# Patient Record
Sex: Male | Born: 1980
Health system: Southern US, Community
[De-identification: ages and names within clinical notes are randomized; demographics above are authoritative.]

## PROBLEM LIST (undated history)

## (undated) DIAGNOSIS — K219 Gastro-esophageal reflux disease without esophagitis: Secondary | ICD-10-CM

## (undated) DIAGNOSIS — J45909 Unspecified asthma, uncomplicated: Secondary | ICD-10-CM

## (undated) DIAGNOSIS — J349 Unspecified disorder of nose and nasal sinuses: Secondary | ICD-10-CM

## (undated) DIAGNOSIS — G43909 Migraine, unspecified, not intractable, without status migrainosus: Secondary | ICD-10-CM

## (undated) DIAGNOSIS — I10 Essential (primary) hypertension: Secondary | ICD-10-CM

## (undated) DIAGNOSIS — F419 Anxiety disorder, unspecified: Secondary | ICD-10-CM

## (undated) DIAGNOSIS — S060X0A Concussion without loss of consciousness, initial encounter: Secondary | ICD-10-CM

## (undated) DIAGNOSIS — M545 Low back pain, unspecified: Secondary | ICD-10-CM

## (undated) HISTORY — DX: Unspecified disorder of nose and nasal sinuses: J34.9

## (undated) HISTORY — DX: Low back pain, unspecified: M54.50

## (undated) HISTORY — DX: Essential (primary) hypertension: I10

## (undated) HISTORY — DX: Gastro-esophageal reflux disease without esophagitis: K21.9

## (undated) HISTORY — DX: Anxiety disorder, unspecified: F41.9

## (undated) HISTORY — DX: Unspecified asthma, uncomplicated: J45.909

## (undated) HISTORY — DX: Concussion without loss of consciousness, initial encounter: S06.0X0A

## (undated) HISTORY — DX: Migraine, unspecified, not intractable, without status migrainosus: G43.909

## (undated) HISTORY — PX: CHOLECYSTECTOMY: SHX55

---

## 2013-12-18 ENCOUNTER — Ambulatory Visit (INDEPENDENT_AMBULATORY_CARE_PROVIDER_SITE_OTHER): Payer: BC Managed Care – PPO

## 2013-12-18 VITALS — BP 110/73 | HR 96 | Resp 18

## 2013-12-18 DIAGNOSIS — B351 Tinea unguium: Secondary | ICD-10-CM

## 2013-12-18 MED ORDER — TAVABOROLE 5 % EX SOLN: CUTANEOUS | Status: DC

## 2013-12-18 NOTE — Progress Notes (Signed)
   Subjective:    Patient ID: George Grant, male    DOB: 11/16/1980, 33 y.o.   MRN: [id number]  HPI I have some toenail fungus on both my big toenails and maybe just check my other nails and they get brittle and discolored and thick and curling in and I have tried all the over the counter stuff and has not helped.    Review of Systems  Constitutional: Positive for fatigue.  HENT: Positive for sinus pressure.   Respiratory:       Uses an inhaler for allergies  Skin: Positive for rash.       Change in nails  All other systems reviewed and are negative.      Objective:   Physical Exam Neurovascular status is intact pedal pulses palpable DP and PT +2/4 capillary refill time 3 seconds epicritic and proprioceptive sensations intact and symmetric bilateral is normal plantar response DTRs not listed neurologically skin color pigment and hair growth are normal orthopedic biomechanical exam reveals rectus foot type dermatologically there is thickening discoloration and friability of nails hallux bilateral no severe of all the lesser nails also shows some thickening yellowing discoloration and brittleness consistent with onychomycosis no secondary bacterial infections no malodor is noted patient's tried a variety topical antifungal treatments he declined Fungi-Nail for the last 6-12 months with some improvement although not consistent clearing which she for      Assessment & Plan:  Assessment onychomycosis criptotic brittle crumbly darkened nails hallux bilateral as well as second through fifth digits bilateral show some dystrophy discoloration and friability patient this time is prescribed new antifungal medications Kerydin with instructions to apply daily as instructed for 12 months duration prescription 40 to be a mail order pharmacy and patient will followup in 6 months for reevaluation and assessment of progress next  [employer] Tyqwan Pink DPm

## 2013-12-18 NOTE — Patient Instructions (Signed)
Onychomycosis/Fungal Toenails  WHAT IS IT? An infection that lies within the keratin of your nail plate that is caused by a fungus.  WHY ME? Fungal infections affect all ages, sexes, races, and creeds.  There may be many factors that predispose you to a fungal infection such as age, coexisting medical conditions such as diabetes, or an autoimmune disease; stress, medications, fatigue, genetics, etc.  Bottom line: fungus thrives in a warm, moist environment and your shoes offer such a location.  IS IT CONTAGIOUS? Theoretically, yes.  You do not want to share shoes, nail clippers or files with someone who has fungal toenails.  Walking around barefoot in the same room or sleeping in the same bed is unlikely to transfer the organism.  It is important to realize, however, that fungus can spread easily from one nail to the next on the same foot.  HOW DO WE TREAT THIS?  There are several ways to treat this condition.  Treatment may depend on many factors such as age, medications, pregnancy, liver and kidney conditions, etc.  It is best to ask your doctor which options are available to you.  1. No treatment.   Unlike many other medical concerns, you can live with this condition.  However for many people this can be a painful condition and may lead to ingrown toenails or a bacterial infection.  It is recommended that you keep the nails cut short to help reduce the amount of fungal nail. 2. Topical treatment.  These range from herbal remedies to prescription strength nail lacquers.  About 40-50% effective, topicals require twice daily application for approximately 9 to 12 months or until an entirely new nail has grown out.  The most effective topicals are medical grade medications available through physicians offices. 3. Oral antifungal medications.  With an 80-90% cure rate, the most common oral medication requires 3 to 4 months of therapy and stays in your system for a year as the new nail grows out.  Oral  antifungal medications do require blood work to make sure it is a safe drug for you.  A liver function panel will be performed prior to starting the medication and after the first month of treatment.  It is important to have the blood work performed to avoid any harmful side effects.  In general, this medication safe but blood work is required. 4. Laser Therapy.  This treatment is performed by applying a specialized laser to the affected nail plate.  This therapy is noninvasive, fast, and non-painful.  It is not covered by insurance and is therefore, out of pocket.  The results have been very good with a 80-95% cure rate.  The [facility] is the only practice in the area to offer this therapy. 5. Permanent Nail Avulsion.  Removing the entire nail so that a new nail will not grow back.  Jodi GeraldsKerydin has been prescribed and the shoe directly to your home, apply once daily to each affected nail as instructed for 12 months

## 2014-02-20 ENCOUNTER — Emergency Department: Payer: Self-pay | Admitting: Emergency Medicine

## 2014-03-03 ENCOUNTER — Telehealth: Payer: Self-pay | Admitting: *Deleted

## 2014-03-03 NOTE — Telephone Encounter (Signed)
Issue with prescription I'm on, Kerydin.  It was doing good for a couple of months.  He at the end of last week, skin around every toe has blistered up.  Please give me a call.

## 2014-03-03 NOTE — Telephone Encounter (Signed)
CALLED PATIENT AND PATIENT STATED THAT HE WAS ON KERYDIN AND COULD SEE IMPROVEMENT AND HE HAD TO TAKE DOXYCYCLINE FOR A SINUS INFECTION BUT TURNED OUT HE WAS ALLERGIC TO IT AND THEN THEY PUT HIM ON ARTHROMYCIN AND THEN HE NOTICED THAT THERE WAS BLISTERS ON HIS TOES AND HE SAID HE DIDN'T WEAR SHOES THE WHOLE WEEKEND AND THEY SEEMED TO BE BETTER TODAY. LISA

## 2014-03-04 NOTE — Telephone Encounter (Signed)
Called patient back and told him to stop the kerydin and to call us if any thing didn't look right. lisa

## 2014-03-04 NOTE — Telephone Encounter (Signed)
Could be a possible interaction between the 2 medications, discontinue the keratin polys on the antibiotic and then after a week to 10 days can resume the topical medication.  Alvan Dameichard Sarahi Borland DPM

## 2014-03-20 ENCOUNTER — Telehealth: Payer: Self-pay | Admitting: *Deleted

## 2014-03-21 NOTE — Telephone Encounter (Signed)
Will have melody call the patient and have him come in. lisa

## 2014-03-31 ENCOUNTER — Telehealth: Payer: Self-pay | Admitting: *Deleted

## 2014-03-31 NOTE — Telephone Encounter (Signed)
If you could give me a call back.  I saw [name] a couple of months ago.  The Jodi Geralds he has me on for my toe fungus is blistering my toes up.  This is the third time I have called.  I quit using it but I was supposed to have been given a call to let me know if I needed to come in or not.  Please give me a call.

## 2014-03-31 NOTE — Telephone Encounter (Signed)
Called patient back and I put patient in at 8:45 am on Wednesday sept 3rd. lisa

## 2014-04-03 ENCOUNTER — Ambulatory Visit (INDEPENDENT_AMBULATORY_CARE_PROVIDER_SITE_OTHER): Payer: BC Managed Care – PPO

## 2014-04-03 VITALS — BP 117/77 | HR 95 | Resp 18

## 2014-04-03 DIAGNOSIS — B351 Tinea unguium: Secondary | ICD-10-CM

## 2014-04-03 MED ORDER — EFINACONAZOLE 10 % EX SOLN: CUTANEOUS | Status: DC

## 2014-04-03 NOTE — Patient Instructions (Signed)

## 2014-04-03 NOTE — Progress Notes (Signed)
   Subjective:    Patient ID: George Grant, male    DOB: Nov 24, 1980, 33 y.o.   MRN: [id number]  HPI I WAS TAKEN THE KERYDIN AND I HAD TO STOP IT DUE TO BLISTERING ON ALL MY TOES AND IT WAS HELPING AND THE BLISTERS WHEN I POPPED THEM WOULD HAVE CLEAR FLUID COMING OUT AND THEY WOULD ITCH AND GET RED AND PEELING    Review of Systems no new findings or systemic changes noted     Objective:   Physical Exam 33 year old white male well-developed well-nourished oriented x3 have been applying keratin for more than 2 months when all of a sudden started developing blisters on his toes he stopped for couple of weeks and they cleared and when he restarted the blisters reappeared once again. Likely develop allergic reaction to the medication we'll discontinue at this time.  Lower extremity objective findings reveal neurovascular status is intact pedal pulses are palpable epicritic and progressive sensations intact nails thick brittle yellow discolored friable consistent with onychomycosis there are improving although needs to continue with treatment this time options for oral Lamisil or topical Amil Amen are given. Patient elected topical medication as alternative.       Assessment & Plan:  Assessment onychomycosis painful mycotic nails were been treated with Jodi Geralds however didn't an allergic reaction we'll switch over here to [place]. Patient will apply once daily as instructed the prescription for did to the on line pharmacy for shipment directly to patient recheck in 6 months for long-term followup will continue with therapy for 6-12 months as instructed  Alvan Dame DPM

## 2014-06-18 ENCOUNTER — Ambulatory Visit: Payer: BC Managed Care – PPO

## 2014-10-02 ENCOUNTER — Ambulatory Visit (INDEPENDENT_AMBULATORY_CARE_PROVIDER_SITE_OTHER): Payer: BLUE CROSS/BLUE SHIELD

## 2014-10-02 VITALS — BP 110/72 | HR 89 | Resp 18

## 2014-10-02 DIAGNOSIS — M79676 Pain in unspecified toe(s): Secondary | ICD-10-CM

## 2014-10-02 DIAGNOSIS — B351 Tinea unguium: Secondary | ICD-10-CM

## 2014-10-02 MED ORDER — EFINACONAZOLE 10 % EX SOLN: CUTANEOUS | Status: DC

## 2014-10-02 NOTE — Progress Notes (Signed)
   Subjective:    Patient ID: George Grant, male    DOB: 11/22/1980, 34 y.o.   MRN: [id number]  HPI I CAN TELL A DIFFERENCE AND THE INSURANCE WILL NOT PAY FOR THE JUBLIA    Review of Systems no new findings or systemic changes noted there is improvement clinically of the nails. Both hallux nail showing clearing proximally still some distal onychomycosis noted    Objective:   Physical Exam Neurovascular status is intact pedal pulses are palpable epicritic and proprioceptive sensations intact patient has been on Jublia using successfully however this time the pharmacies no longer entering prescription as far as cost we will have to switch over to the [facility] prescription hard. At this time a new prescription for Jublia is issued with 2 refills will continue with daily application as instructed apply just one drop each nail as instructed patient is been painting the nails. Patient will get [facility] prescription current authorized for the Jublia and we'll follow-up with in 6 months after continued use of the antifungal medication also suggested some vinegar and water soaks occasionally as an adjunct       Assessment & Plan:  Assessment improving painful mycotic nails filed treatment with Jublia at this time new refill for Jublia is issued and the patient will be followed in 6 months  Alvan Dameichard Rexann Lueras DPM

## 2015-03-05 ENCOUNTER — Telehealth: Payer: Self-pay | Admitting: *Deleted

## 2015-03-05 NOTE — Telephone Encounter (Signed)
George Grant states pt has refills for the Jublia, but for insurance purposes needs to change from [name].  Dr. Everlena Cooper the change to him and I called to the [facility].

## 2015-04-09 ENCOUNTER — Ambulatory Visit: Payer: BLUE CROSS/BLUE SHIELD

## 2015-04-13 ENCOUNTER — Ambulatory Visit (INDEPENDENT_AMBULATORY_CARE_PROVIDER_SITE_OTHER): Payer: BLUE CROSS/BLUE SHIELD | Admitting: Podiatry

## 2015-04-13 ENCOUNTER — Encounter: Payer: Self-pay | Admitting: Podiatry

## 2015-04-13 VITALS — BP 118/76 | HR 80 | Resp 12

## 2015-04-13 DIAGNOSIS — B351 Tinea unguium: Secondary | ICD-10-CM

## 2015-04-13 NOTE — Progress Notes (Signed)
Subjective:     Patient ID: George Grant, male   DOB: Oct 19, 1980, 34 y.o.   MRN: [id number]  HPIThis patient presents to the office to discuss his big toenails.  He says he has been using Jublia for over a year and he says there has been improvement but is concerned it has taken over a year.     Review of Systems     Objective:   Physical Exam GENERAL APPEARANCE: Alert, conversant. Appropriately groomed. No acute distress.  VASCULAR: Pedal pulses palpable at  [facility] and PT bilateral.  Capillary refill time is immediate to all digits,  Normal temperature gradient.  Digital hair growth is present bilateral  NEUROLOGIC: sensation is normal to 5.07 monofilament at 5/5 sites bilateral.  Light touch is intact bilateral, Muscle strength normal.  MUSCULOSKELETAL: acceptable muscle strength, tone and stability bilateral.  Intrinsic muscluature intact bilateral.  Rectus appearance of foot and digits noted bilateral.   DERMATOLOGIC: skin color, texture, and turgor are within normal limits.  No preulcerative lesions or ulcers  are seen, no interdigital maceration noted.  No open lesions present.  Digital nails are asymptomatic. No drainage noted. NAILS  The hallux right toenail and fifth toenail left foot have thick disfigured discolored nails both feet.     Assessment:  Onychomycosis B/L     Plan:     ROV>  Discussed treatment  .  Decided to order bloodwork and then change medicine to Lamisil.  To prescribe Lamisil once bloodwork is received.

## 2015-04-21 ENCOUNTER — Telehealth: Payer: Self-pay | Admitting: *Deleted

## 2015-04-21 DIAGNOSIS — Z79899 Other long term (current) drug therapy: Secondary | ICD-10-CM

## 2015-04-21 MED ORDER — TERBINAFINE HCL 250 MG PO TABS: 250.0000 mg | ORAL_TABLET | Freq: Every day | ORAL | Status: DC

## 2015-04-21 NOTE — Telephone Encounter (Signed)
Pt states he would like his blood work results and is the medication going to be called in.  Dr. Stacie Acres states pt's enzymes are okay may begin the Lamisil 239m #90 one tablet daily until gone, and repeat the Hepatic function after completion.  Pt is informed.

## 2015-05-08 NOTE — Telephone Encounter (Signed)
Entered in error

## 2015-07-15 ENCOUNTER — Telehealth: Payer: Self-pay | Admitting: *Deleted

## 2015-07-15 NOTE — Telephone Encounter (Signed)
Pt states he was told to have blood work when he finished his Lamisil.  I asked pt if he had taken all 90 doses of the lamisil and he said he was only given 60 doses by [facility].  I told him a order for 90 doses of Lamisil had been e-scribed and received on 04/21/2015 at 127pm, that they may have given him a partial rx due to supply that he needed to check with [facility] to make sure he had completed therapy.  I told pt I would check with Dr. Stacie AcresMayer if he wanted the blood work ordered after the completed therapy.

## 2017-12-31 DIAGNOSIS — J209 Acute bronchitis, unspecified: Secondary | ICD-10-CM | POA: Diagnosis not present

## 2018-02-26 ENCOUNTER — Ambulatory Visit
Admission: RE | Admit: 2018-02-26 | Discharge: 2018-02-26 | Disposition: A | Discharge status: Home / Self Care | Payer: Worker's Compensation | Source: Ambulatory Visit | Attending: Physician Assistant | Admitting: Physician Assistant

## 2018-02-26 ENCOUNTER — Other Ambulatory Visit: Payer: Self-pay | Admitting: Physician Assistant

## 2018-02-26 DIAGNOSIS — M419 Scoliosis, unspecified: Secondary | ICD-10-CM | POA: Insufficient documentation

## 2018-02-26 DIAGNOSIS — M545 Low back pain: Secondary | ICD-10-CM

## 2018-05-09 DIAGNOSIS — M546 Pain in thoracic spine: Secondary | ICD-10-CM | POA: Diagnosis not present

## 2018-05-09 DIAGNOSIS — M5136 Other intervertebral disc degeneration, lumbar region: Secondary | ICD-10-CM | POA: Diagnosis not present

## 2018-05-09 DIAGNOSIS — M62830 Muscle spasm of back: Secondary | ICD-10-CM | POA: Diagnosis not present

## 2018-05-09 DIAGNOSIS — M5441 Lumbago with sciatica, right side: Secondary | ICD-10-CM | POA: Diagnosis not present

## 2018-06-05 DIAGNOSIS — M5441 Lumbago with sciatica, right side: Secondary | ICD-10-CM | POA: Diagnosis not present

## 2018-06-05 DIAGNOSIS — M546 Pain in thoracic spine: Secondary | ICD-10-CM | POA: Diagnosis not present

## 2018-06-05 DIAGNOSIS — M62830 Muscle spasm of back: Secondary | ICD-10-CM | POA: Diagnosis not present

## 2018-06-05 DIAGNOSIS — M5136 Other intervertebral disc degeneration, lumbar region: Secondary | ICD-10-CM | POA: Diagnosis not present

## 2018-06-18 DIAGNOSIS — R10811 Right upper quadrant abdominal tenderness: Secondary | ICD-10-CM | POA: Diagnosis not present

## 2018-06-18 DIAGNOSIS — K210 Gastro-esophageal reflux disease with esophagitis: Secondary | ICD-10-CM | POA: Diagnosis not present

## 2018-07-03 DIAGNOSIS — M546 Pain in thoracic spine: Secondary | ICD-10-CM | POA: Diagnosis not present

## 2018-07-03 DIAGNOSIS — M5136 Other intervertebral disc degeneration, lumbar region: Secondary | ICD-10-CM | POA: Diagnosis not present

## 2018-07-03 DIAGNOSIS — M62830 Muscle spasm of back: Secondary | ICD-10-CM | POA: Diagnosis not present

## 2018-07-03 DIAGNOSIS — M5441 Lumbago with sciatica, right side: Secondary | ICD-10-CM | POA: Diagnosis not present

## 2018-07-04 DIAGNOSIS — R948 Abnormal results of function studies of other organs and systems: Secondary | ICD-10-CM | POA: Diagnosis not present

## 2018-07-04 DIAGNOSIS — R10811 Right upper quadrant abdominal tenderness: Secondary | ICD-10-CM | POA: Diagnosis not present

## 2018-07-09 DIAGNOSIS — Z6831 Body mass index (BMI) 31.0-31.9, adult: Secondary | ICD-10-CM | POA: Insufficient documentation

## 2018-07-09 DIAGNOSIS — K811 Chronic cholecystitis: Secondary | ICD-10-CM | POA: Diagnosis not present

## 2018-07-09 DIAGNOSIS — R1013 Epigastric pain: Secondary | ICD-10-CM | POA: Diagnosis not present

## 2018-07-09 DIAGNOSIS — K828 Other specified diseases of gallbladder: Secondary | ICD-10-CM | POA: Insufficient documentation

## 2018-07-18 DIAGNOSIS — Z6831 Body mass index (BMI) 31.0-31.9, adult: Secondary | ICD-10-CM | POA: Diagnosis not present

## 2018-07-18 DIAGNOSIS — K219 Gastro-esophageal reflux disease without esophagitis: Secondary | ICD-10-CM | POA: Diagnosis not present

## 2018-07-18 DIAGNOSIS — K828 Other specified diseases of gallbladder: Secondary | ICD-10-CM | POA: Diagnosis not present

## 2018-07-18 DIAGNOSIS — K839 Disease of biliary tract, unspecified: Secondary | ICD-10-CM | POA: Diagnosis not present

## 2018-07-18 DIAGNOSIS — K811 Chronic cholecystitis: Secondary | ICD-10-CM | POA: Diagnosis not present

## 2018-07-18 DIAGNOSIS — E669 Obesity, unspecified: Secondary | ICD-10-CM | POA: Diagnosis not present

## 2018-07-30 DIAGNOSIS — Z09 Encounter for follow-up examination after completed treatment for conditions other than malignant neoplasm: Secondary | ICD-10-CM | POA: Insufficient documentation

## 2018-08-09 DIAGNOSIS — J028 Acute pharyngitis due to other specified organisms: Secondary | ICD-10-CM | POA: Diagnosis not present

## 2018-08-09 DIAGNOSIS — J09X2 Influenza due to identified novel influenza A virus with other respiratory manifestations: Secondary | ICD-10-CM | POA: Diagnosis not present

## 2018-08-21 DIAGNOSIS — M5441 Lumbago with sciatica, right side: Secondary | ICD-10-CM | POA: Diagnosis not present

## 2018-08-21 DIAGNOSIS — M62830 Muscle spasm of back: Secondary | ICD-10-CM | POA: Diagnosis not present

## 2018-08-21 DIAGNOSIS — M5136 Other intervertebral disc degeneration, lumbar region: Secondary | ICD-10-CM | POA: Diagnosis not present

## 2018-08-21 DIAGNOSIS — M546 Pain in thoracic spine: Secondary | ICD-10-CM | POA: Diagnosis not present

## 2018-09-11 DIAGNOSIS — M546 Pain in thoracic spine: Secondary | ICD-10-CM | POA: Diagnosis not present

## 2018-09-11 DIAGNOSIS — M62830 Muscle spasm of back: Secondary | ICD-10-CM | POA: Diagnosis not present

## 2018-09-11 DIAGNOSIS — M5441 Lumbago with sciatica, right side: Secondary | ICD-10-CM | POA: Diagnosis not present

## 2018-09-11 DIAGNOSIS — M5136 Other intervertebral disc degeneration, lumbar region: Secondary | ICD-10-CM | POA: Diagnosis not present

## 2018-09-13 DIAGNOSIS — K219 Gastro-esophageal reflux disease without esophagitis: Secondary | ICD-10-CM | POA: Diagnosis not present

## 2018-10-09 DIAGNOSIS — M62830 Muscle spasm of back: Secondary | ICD-10-CM | POA: Diagnosis not present

## 2018-10-09 DIAGNOSIS — M546 Pain in thoracic spine: Secondary | ICD-10-CM | POA: Diagnosis not present

## 2018-10-09 DIAGNOSIS — M5136 Other intervertebral disc degeneration, lumbar region: Secondary | ICD-10-CM | POA: Diagnosis not present

## 2018-10-09 DIAGNOSIS — M5441 Lumbago with sciatica, right side: Secondary | ICD-10-CM | POA: Diagnosis not present

## 2018-10-28 IMAGING — CR DG LUMBAR SPINE COMPLETE 4+V
5 series · 6 of 6 positions shown · non-contrast
Comparison: None.

CLINICAL DATA: Acute lumbar pain.

EXAM:
LUMBAR SPINE - COMPLETE 4+ VIEW

[l-spine ap]
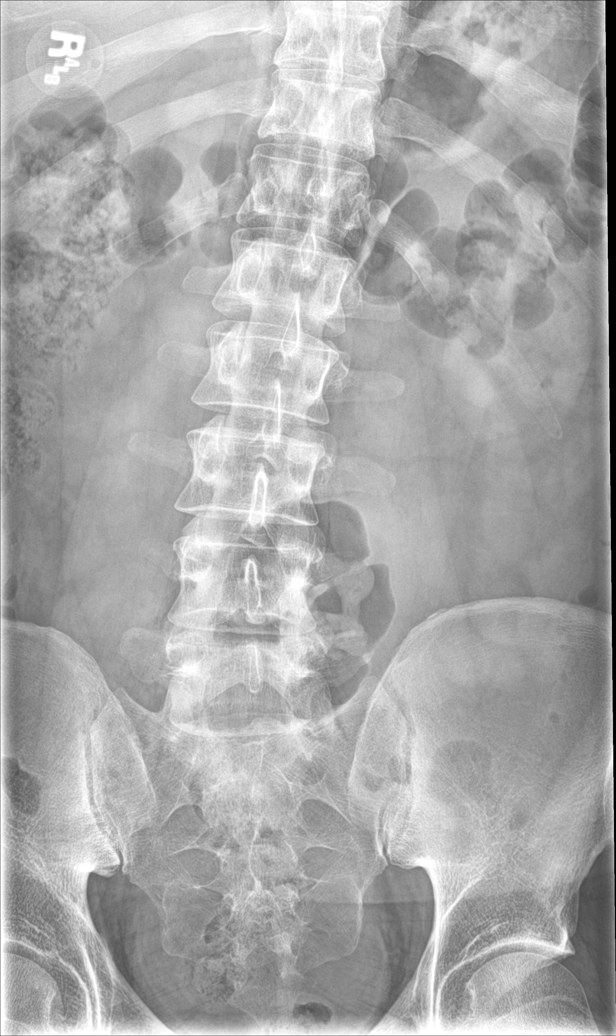

[l-spine obl (1 of 2)]
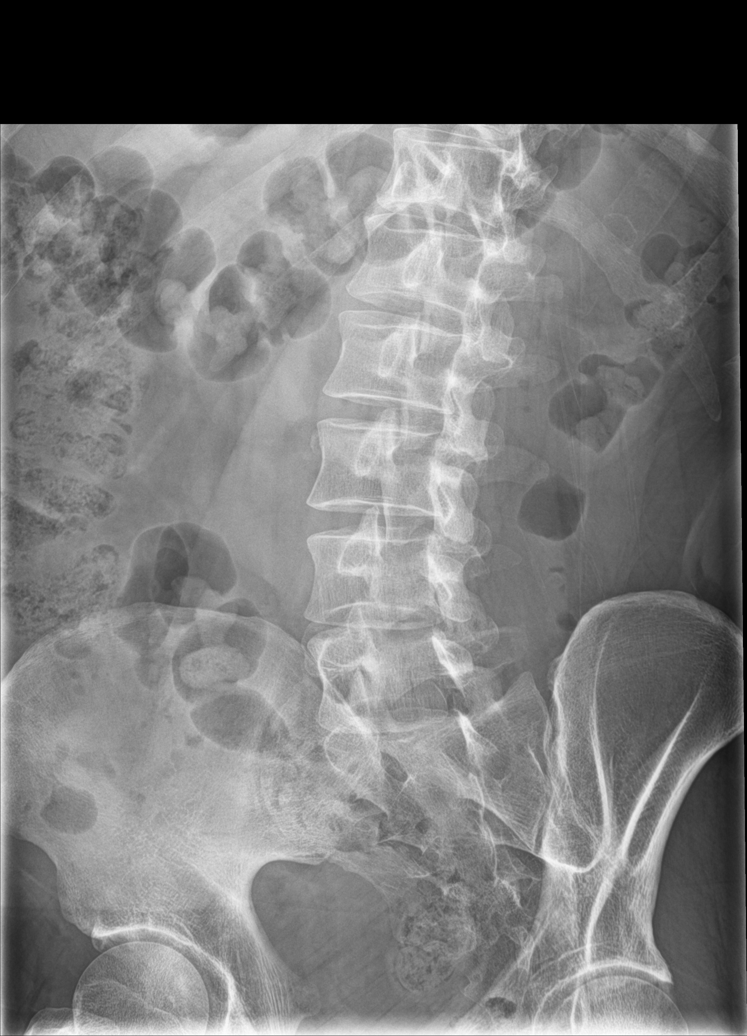

[l-spine obl (2 of 2)]
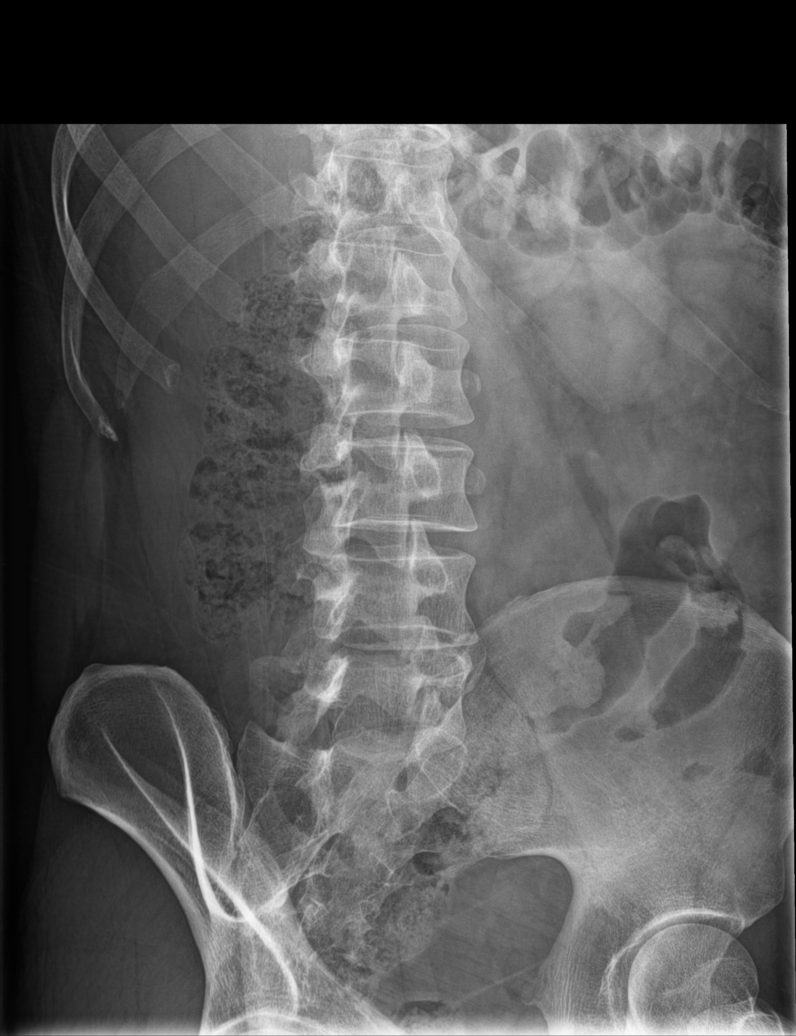

[Series 4: l-spine lat · 0.14mm/px · 2 of 2 slices shown]
[im 1/2]
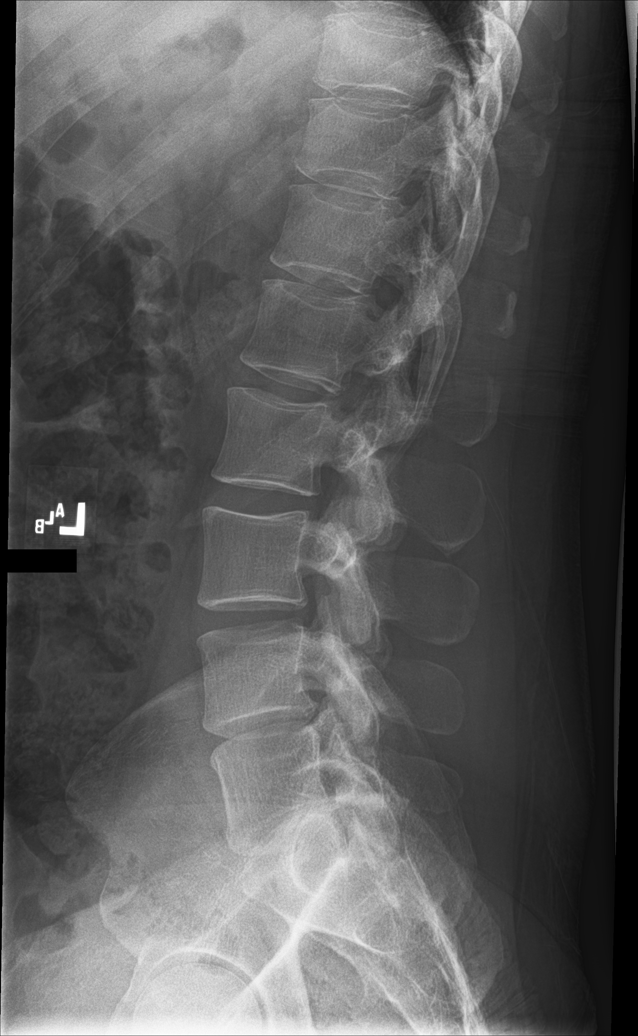
[im 2/2]
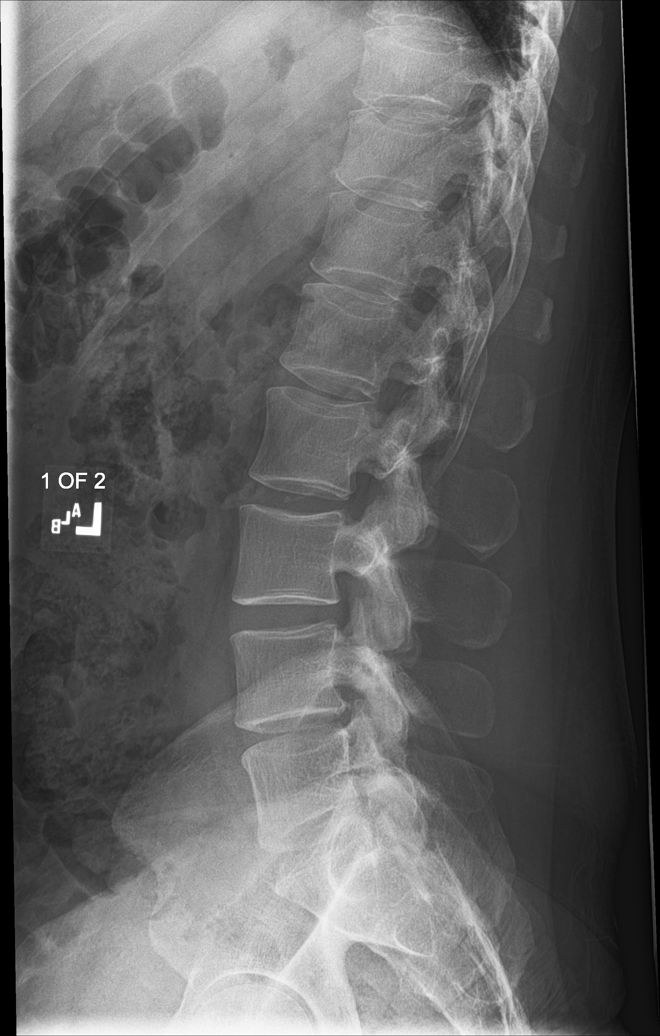

[l-spine spot]
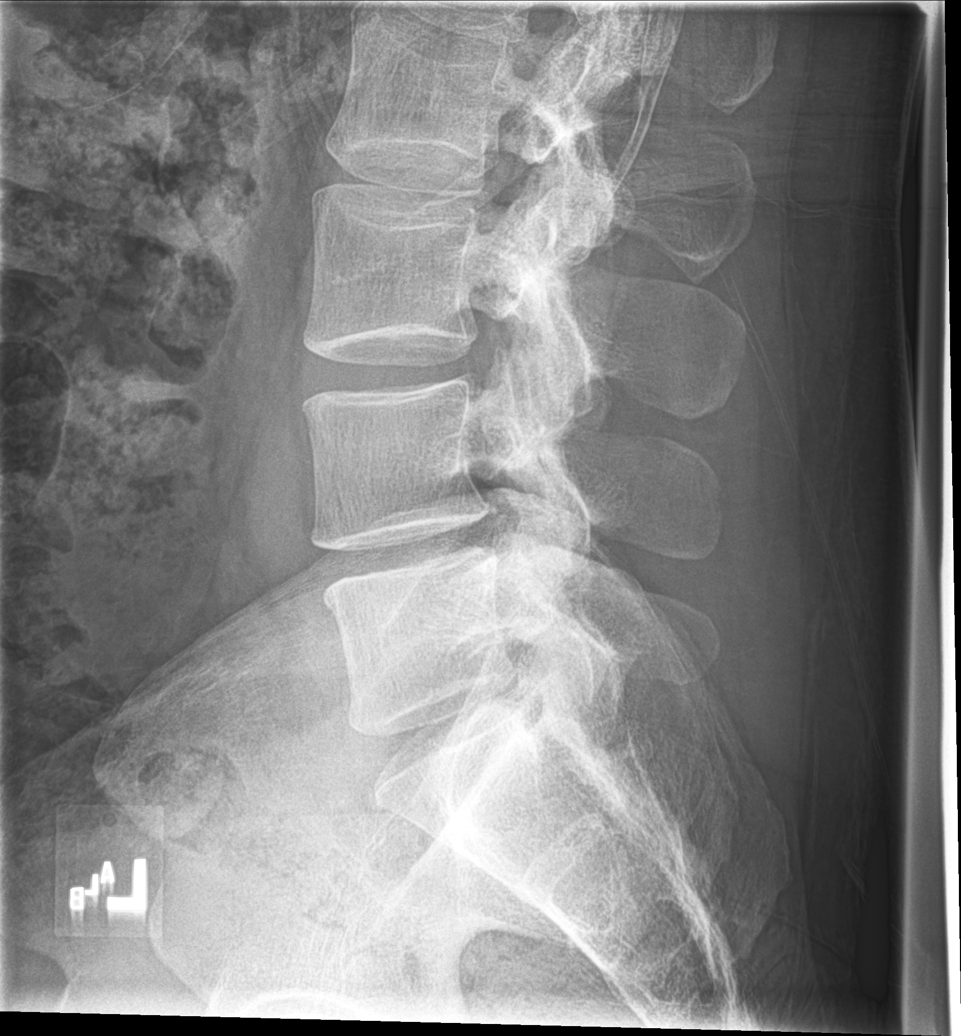

[6 of 6 positions shown; findings below may reference images not displayed]

FINDINGS: Dextroscoliosis of the lumbar spine, mild to moderate in degree. No
evidence of acute vertebral body subluxation. No fracture line or
displaced fracture fragment. No acute or suspicious osseous lesion.
No evidence of pars interarticularis defect. Disc spaces are well
maintained. Visualized paravertebral soft tissues are unremarkable.
IMPRESSION: 1. No acute findings.
2. Scoliosis of the lumbar spine, mild to moderate in degree.

## 2019-02-26 DIAGNOSIS — M62830 Muscle spasm of back: Secondary | ICD-10-CM | POA: Diagnosis not present

## 2019-02-26 DIAGNOSIS — M546 Pain in thoracic spine: Secondary | ICD-10-CM | POA: Diagnosis not present

## 2019-02-26 DIAGNOSIS — M5136 Other intervertebral disc degeneration, lumbar region: Secondary | ICD-10-CM | POA: Diagnosis not present

## 2019-02-26 DIAGNOSIS — M5441 Lumbago with sciatica, right side: Secondary | ICD-10-CM | POA: Diagnosis not present

## 2019-03-26 DIAGNOSIS — M5441 Lumbago with sciatica, right side: Secondary | ICD-10-CM | POA: Diagnosis not present

## 2019-03-26 DIAGNOSIS — M62830 Muscle spasm of back: Secondary | ICD-10-CM | POA: Diagnosis not present

## 2019-03-26 DIAGNOSIS — M546 Pain in thoracic spine: Secondary | ICD-10-CM | POA: Diagnosis not present

## 2019-03-26 DIAGNOSIS — M5136 Other intervertebral disc degeneration, lumbar region: Secondary | ICD-10-CM | POA: Diagnosis not present

## 2019-04-23 DIAGNOSIS — M5441 Lumbago with sciatica, right side: Secondary | ICD-10-CM | POA: Diagnosis not present

## 2019-04-23 DIAGNOSIS — M5136 Other intervertebral disc degeneration, lumbar region: Secondary | ICD-10-CM | POA: Diagnosis not present

## 2019-04-23 DIAGNOSIS — M546 Pain in thoracic spine: Secondary | ICD-10-CM | POA: Diagnosis not present

## 2019-04-23 DIAGNOSIS — M62830 Muscle spasm of back: Secondary | ICD-10-CM | POA: Diagnosis not present

## 2019-05-08 DIAGNOSIS — Z1159 Encounter for screening for other viral diseases: Secondary | ICD-10-CM | POA: Diagnosis not present

## 2019-05-08 DIAGNOSIS — J018 Other acute sinusitis: Secondary | ICD-10-CM | POA: Diagnosis not present

## 2019-05-28 DIAGNOSIS — M5136 Other intervertebral disc degeneration, lumbar region: Secondary | ICD-10-CM | POA: Diagnosis not present

## 2019-05-28 DIAGNOSIS — M546 Pain in thoracic spine: Secondary | ICD-10-CM | POA: Diagnosis not present

## 2019-05-28 DIAGNOSIS — M62830 Muscle spasm of back: Secondary | ICD-10-CM | POA: Diagnosis not present

## 2019-05-28 DIAGNOSIS — M5441 Lumbago with sciatica, right side: Secondary | ICD-10-CM | POA: Diagnosis not present

## 2019-06-24 DIAGNOSIS — M5441 Lumbago with sciatica, right side: Secondary | ICD-10-CM | POA: Diagnosis not present

## 2019-06-24 DIAGNOSIS — M546 Pain in thoracic spine: Secondary | ICD-10-CM | POA: Diagnosis not present

## 2019-06-24 DIAGNOSIS — M62830 Muscle spasm of back: Secondary | ICD-10-CM | POA: Diagnosis not present

## 2019-06-24 DIAGNOSIS — M5136 Other intervertebral disc degeneration, lumbar region: Secondary | ICD-10-CM | POA: Diagnosis not present

## 2019-07-22 DIAGNOSIS — M5441 Lumbago with sciatica, right side: Secondary | ICD-10-CM | POA: Diagnosis not present

## 2019-07-22 DIAGNOSIS — M5136 Other intervertebral disc degeneration, lumbar region: Secondary | ICD-10-CM | POA: Diagnosis not present

## 2019-07-22 DIAGNOSIS — M546 Pain in thoracic spine: Secondary | ICD-10-CM | POA: Diagnosis not present

## 2019-07-22 DIAGNOSIS — M62830 Muscle spasm of back: Secondary | ICD-10-CM | POA: Diagnosis not present

## 2019-08-03 DIAGNOSIS — J0300 Acute streptococcal tonsillitis, unspecified: Secondary | ICD-10-CM | POA: Diagnosis not present

## 2019-08-19 DIAGNOSIS — M62830 Muscle spasm of back: Secondary | ICD-10-CM | POA: Diagnosis not present

## 2019-08-19 DIAGNOSIS — M5136 Other intervertebral disc degeneration, lumbar region: Secondary | ICD-10-CM | POA: Diagnosis not present

## 2019-08-19 DIAGNOSIS — M5441 Lumbago with sciatica, right side: Secondary | ICD-10-CM | POA: Diagnosis not present

## 2019-08-19 DIAGNOSIS — M546 Pain in thoracic spine: Secondary | ICD-10-CM | POA: Diagnosis not present

## 2019-09-16 DIAGNOSIS — M5136 Other intervertebral disc degeneration, lumbar region: Secondary | ICD-10-CM | POA: Diagnosis not present

## 2019-09-16 DIAGNOSIS — M5441 Lumbago with sciatica, right side: Secondary | ICD-10-CM | POA: Diagnosis not present

## 2019-09-16 DIAGNOSIS — M546 Pain in thoracic spine: Secondary | ICD-10-CM | POA: Diagnosis not present

## 2019-09-16 DIAGNOSIS — M62830 Muscle spasm of back: Secondary | ICD-10-CM | POA: Diagnosis not present

## 2019-10-14 DIAGNOSIS — M5441 Lumbago with sciatica, right side: Secondary | ICD-10-CM | POA: Diagnosis not present

## 2019-10-14 DIAGNOSIS — M62830 Muscle spasm of back: Secondary | ICD-10-CM | POA: Diagnosis not present

## 2019-10-14 DIAGNOSIS — M546 Pain in thoracic spine: Secondary | ICD-10-CM | POA: Diagnosis not present

## 2019-10-14 DIAGNOSIS — M5136 Other intervertebral disc degeneration, lumbar region: Secondary | ICD-10-CM | POA: Diagnosis not present

## 2019-11-11 DIAGNOSIS — M62830 Muscle spasm of back: Secondary | ICD-10-CM | POA: Diagnosis not present

## 2019-11-11 DIAGNOSIS — M5441 Lumbago with sciatica, right side: Secondary | ICD-10-CM | POA: Diagnosis not present

## 2019-11-11 DIAGNOSIS — M546 Pain in thoracic spine: Secondary | ICD-10-CM | POA: Diagnosis not present

## 2019-11-11 DIAGNOSIS — M5136 Other intervertebral disc degeneration, lumbar region: Secondary | ICD-10-CM | POA: Diagnosis not present

## 2019-11-19 DIAGNOSIS — K219 Gastro-esophageal reflux disease without esophagitis: Secondary | ICD-10-CM | POA: Diagnosis not present

## 2019-12-02 DIAGNOSIS — M62830 Muscle spasm of back: Secondary | ICD-10-CM | POA: Diagnosis not present

## 2019-12-02 DIAGNOSIS — M5441 Lumbago with sciatica, right side: Secondary | ICD-10-CM | POA: Diagnosis not present

## 2019-12-02 DIAGNOSIS — M5136 Other intervertebral disc degeneration, lumbar region: Secondary | ICD-10-CM | POA: Diagnosis not present

## 2019-12-02 DIAGNOSIS — M546 Pain in thoracic spine: Secondary | ICD-10-CM | POA: Diagnosis not present

## 2019-12-09 DIAGNOSIS — M5136 Other intervertebral disc degeneration, lumbar region: Secondary | ICD-10-CM | POA: Diagnosis not present

## 2019-12-09 DIAGNOSIS — M62830 Muscle spasm of back: Secondary | ICD-10-CM | POA: Diagnosis not present

## 2019-12-09 DIAGNOSIS — M5441 Lumbago with sciatica, right side: Secondary | ICD-10-CM | POA: Diagnosis not present

## 2019-12-09 DIAGNOSIS — M546 Pain in thoracic spine: Secondary | ICD-10-CM | POA: Diagnosis not present

## 2020-01-06 ENCOUNTER — Ambulatory Visit: Payer: BC Managed Care – PPO | Admitting: Physician Assistant

## 2020-01-06 ENCOUNTER — Other Ambulatory Visit: Payer: Self-pay

## 2020-01-06 ENCOUNTER — Encounter: Payer: Self-pay | Admitting: Physician Assistant

## 2020-01-06 VITALS — BP 106/72 | HR 91 | Temp 97.7°F | Ht 68.0 in | Wt 210.0 lb

## 2020-01-06 DIAGNOSIS — R197 Diarrhea, unspecified: Secondary | ICD-10-CM | POA: Insufficient documentation

## 2020-01-06 MED ORDER — DIPHENOXYLATE-ATROPINE 2.5-0.025 MG PO TABS: 1.0000 | ORAL_TABLET | Freq: Four times a day (QID) | ORAL | 0 refills | Status: DC | PRN

## 2020-01-06 NOTE — Assessment & Plan Note (Signed)
Clear liquids, bland diet Trial lomotil - to follow up if symptoms persist/worsen

## 2020-01-06 NOTE — Progress Notes (Signed)
Acute Office Visit  Subjective:    Patient ID: George Grant, male    DOB: March 25, 1981, 39 y.o.   MRN: [id number]  Chief Complaint  Patient presents with  . Diarrhea    since last friday!    HPI Patient is in today for diarrhea Pt states he and his daughter have had stomach upset and diarrhea - daughter with vomiting also Patient states he has had 6-8 watery stools for past few days Denies melena, hematochezia, vomiting, fever Mild abdominal cramps but no significant pain  Past Medical History:  Diagnosis Date  . Asthma   . Concussion without loss of consciousness   . GERD (gastroesophageal reflux disease)   . Sinus problem     Past Surgical History:  Procedure Laterality Date  . CHOLECYSTECTOMY      Family History  Problem Relation Age of Onset  . Skin cancer Mother   . Skin cancer Father     Social History   Socioeconomic History  . Marital status: Married    Spouse name: Not on file  . Number of children: Not on file  . Years of education: Not on file  . Highest education level: Not on file  Occupational History  . Not on file  Tobacco Use  . Smoking status: Never Smoker  . Smokeless tobacco: Never Used  Substance and Sexual Activity  . Alcohol use: Yes  . Drug use: No  . Sexual activity: Not on file  Other Topics Concern  . Not on file  Social History Narrative  . Not on file   Social Determinants of Health   Financial Resource Strain:   . Difficulty of Paying Living Expenses:   Food Insecurity:   . Worried About Charity fundraiser in the Last Year:   . Arboriculturist in the Last Year:   Transportation Needs:   . Film/video editor (Medical):   Marland Kitchen Lack of Transportation (Non-Medical):   Physical Activity:   . Days of Exercise per Week:   . Minutes of Exercise per Session:   Stress:   . Feeling of Stress :   Social Connections:   . Frequency of Communication with Friends and Family:   . Frequency of Social Gatherings with Friends and  Family:   . Attends Religious Services:   . Active Member of Clubs or Organizations:   . Attends Archivist Meetings:   Marland Kitchen Marital Status:   Intimate Partner Violence:   . Fear of Current or Ex-Partner:   . Emotionally Abused:   Marland Kitchen Physically Abused:   . Sexually Abused:      Current Outpatient Medications:  .  famotidine (PEPCID) 40 MG tablet, TAKE 1 TABLET BY MOUTH UP TO TWICE DAILY AS NEEDED FOR BREAKTHROUGH HEARTBURN, Disp: , Rfl:  .  fexofenadine (ALLEGRA) 180 MG tablet, Take by mouth., Disp: , Rfl:  .  pantoprazole (PROTONIX) 40 MG tablet, Take 40 mg by mouth daily., Disp: , Rfl:  .  diphenoxylate-atropine (LOMOTIL) 2.5-0.025 MG tablet, Take 1 tablet by mouth 4 (four) times daily as needed for diarrhea or loose stools., Disp: 30 tablet, Rfl: 0   Allergies  Allergen Reactions  . Amoxicillin Hives  . Penicillin G Hives  . Penicillins     amoxicillin  . Sulfamethoxazole-Trimethoprim Hives    CONSTITUTIONAL: Negative for chills, fatigue, fever, unintentional weight gain and unintentional weight loss.   CARDIOVASCULAR: Negative for chest pain, dizziness, palpitations and pedal edema.  RESPIRATORY: Negative for recent  cough and dyspnea.  GASTROINTESTINAL:see HPI MSK: Negative for arthralgias and myalgias.  INTEGUMENTARY: Negative for rash.       Objective:    PHYSICAL EXAM:   VS: BP 106/72 (BP Location: Left Arm, Patient Position: Sitting)   Pulse 91   Temp 97.7 F (36.5 C) (Temporal)   Ht 5\' 8"  (1.727 m)   Wt 210 lb (95.3 kg)   SpO2 98%   BMI 31.93 kg/m   GEN: Well nourished, well developed, in no acute distress   Cardiac: RRR; no murmurs, rubs, or gallops,no edema - no significant varicosities Respiratory:  normal respiratory rate and pattern with no distress - normal breath sounds with no rales, rhonchi, wheezes or rubs GI: normal bowel sounds, no masses or tenderness  Skin: warm and dry, no rash     Wt Readings from Last 3 Encounters:    01/06/20 210 lb (95.3 kg)    Health Maintenance Due  Topic Date Due  . Hepatitis C Screening  Never done  . HIV Screening  Never done  . TETANUS/TDAP  Never done    There are no preventive care reminders to display for this patient.        Assessment & Plan:   Problem List Items Addressed This Visit      Other   Diarrhea - Primary       Meds ordered this encounter  Medications  . diphenoxylate-atropine (LOMOTIL) 2.5-0.025 MG tablet    Sig: Take 1 tablet by mouth 4 (four) times daily as needed for diarrhea or loose stools.    Dispense:  30 tablet    Refill:  0    Order Specific Question:   Supervising Provider    Answer:   [facility], 10-18-1989 Fritzi Mandes     SARA R Lino Wickliff, PA-C

## 2020-01-10 DIAGNOSIS — M62830 Muscle spasm of back: Secondary | ICD-10-CM | POA: Diagnosis not present

## 2020-01-10 DIAGNOSIS — M5441 Lumbago with sciatica, right side: Secondary | ICD-10-CM | POA: Diagnosis not present

## 2020-01-10 DIAGNOSIS — M546 Pain in thoracic spine: Secondary | ICD-10-CM | POA: Diagnosis not present

## 2020-01-10 DIAGNOSIS — M5136 Other intervertebral disc degeneration, lumbar region: Secondary | ICD-10-CM | POA: Diagnosis not present

## 2020-01-28 ENCOUNTER — Other Ambulatory Visit: Payer: Self-pay

## 2020-01-28 ENCOUNTER — Ambulatory Visit (INDEPENDENT_AMBULATORY_CARE_PROVIDER_SITE_OTHER): Payer: BC Managed Care – PPO | Admitting: Legal Medicine

## 2020-01-28 ENCOUNTER — Encounter: Payer: Self-pay | Admitting: Legal Medicine

## 2020-01-28 VITALS — BP 124/78 | HR 89 | Temp 97.4°F | Resp 16 | Ht 71.0 in | Wt 210.8 lb

## 2020-01-28 DIAGNOSIS — Z0000 Encounter for general adult medical examination without abnormal findings: Secondary | ICD-10-CM

## 2020-01-28 NOTE — Progress Notes (Signed)
Subjective:  Patient ID: George Grant, male    DOB: 03/15/1981  Age: 39 y.o. MRN: [id number]  Chief Complaint  Patient presents with  . Annual Exam    HPI  Well Adult Physical: Patient here for a comprehensive physical exam.The patient reports Sinus headaches, asthma, GERD Do you take any herbs or supplements that were not prescribed by a doctor? no Are you taking calcium supplements? no Are you taking aspirin daily? no  Encounter for general adult medical examination without abnormal findings  Physical ("At Risk" items are starred): Patient's last physical exam was 1 year ago .  Weight: Appropriate for height (BMI less than 31%) ;  Blood Pressure: Normal (BP less than 124/78) ;  Medical History: Patient history reviewed ; Family history reviewed ;  Allergies Reviewed: No change in current allergies ;  Medications Reviewed: Medications reviewed - no changes ;  Lipids: Normal lipid levels ;  Smoking: Life-long non-smoker ;  Physical Activity: Exercises at least 3 times per week ;  Alcohol/Drug Use: Is a non-drinker ; No illicit drug use ;  Patient is not afflicted from Stress Incontinence and Urge Incontinence  Safety: reviewed ; Patient wears a seat belt, has smoke detectors, has carbon monoxide detectors, practices appropriate gun safety, and wears sunscreen with extended sun exposure. Dental Care: biannual cleanings, brushes and flosses daily. Ophthalmology/Optometry: Annual visit.  Hearing loss: none Vision impairments: none Last PSA: na    Office Visit from 01/28/2020 in [facility]  PHQ-2 Total Score 0              Social History   Socioeconomic History  . Marital status: Married    Spouse name: Not on file  . Number of children: Not on file  . Years of education: Not on file  . Highest education level: Not on file  Occupational History  . Not on file  Tobacco Use  . Smoking status: Never Smoker  . Smokeless tobacco: Never Used  Vaping Use  . Vaping  Use: Never used  Substance and Sexual Activity  . Alcohol use: Yes  . Drug use: No  . Sexual activity: Not on file  Other Topics Concern  . Not on file  Social History Narrative  . Not on file   Social Determinants of Health   Financial Resource Strain:   . Difficulty of Paying Living Expenses:   Food Insecurity:   . Worried About Programme researcher, broadcasting/film/video in the Last Year:   . Barista in the Last Year:   Transportation Needs:   . Freight forwarder (Medical):   Marland Kitchen Lack of Transportation (Non-Medical):   Physical Activity:   . Days of Exercise per Week:   . Minutes of Exercise per Session:   Stress:   . Feeling of Stress :   Social Connections:   . Frequency of Communication with Friends and Family:   . Frequency of Social Gatherings with Friends and Family:   . Attends Religious Services:   . Active Member of Clubs or Organizations:   . Attends Banker Meetings:   Marland Kitchen Marital Status:    Past Medical History:  Diagnosis Date  . Asthma   . Concussion without loss of consciousness   . GERD (gastroesophageal reflux disease)   . Sinus problem    Past Surgical History:  Procedure Laterality Date  . CHOLECYSTECTOMY      Family History  Problem Relation Age of Onset  . Skin cancer  Mother   . Skin cancer Father    Social History   Socioeconomic History  . Marital status: Married    Spouse name: Not on file  . Number of children: Not on file  . Years of education: Not on file  . Highest education level: Not on file  Occupational History  . Not on file  Tobacco Use  . Smoking status: Never Smoker  . Smokeless tobacco: Never Used  Vaping Use  . Vaping Use: Never used  Substance and Sexual Activity  . Alcohol use: Yes  . Drug use: No  . Sexual activity: Not on file  Other Topics Concern  . Not on file  Social History Narrative  . Not on file   Social Determinants of Health   Financial Resource Strain:   . Difficulty of Paying Living  Expenses:   Food Insecurity:   . Worried About Programme researcher, broadcasting/film/video in the Last Year:   . Barista in the Last Year:   Transportation Needs:   . Freight forwarder (Medical):   Marland Kitchen Lack of Transportation (Non-Medical):   Physical Activity:   . Days of Exercise per Week:   . Minutes of Exercise per Session:   Stress:   . Feeling of Stress :   Social Connections:   . Frequency of Communication with Friends and Family:   . Frequency of Social Gatherings with Friends and Family:   . Attends Religious Services:   . Active Member of Clubs or Organizations:   . Attends Banker Meetings:   Marland Kitchen Marital Status:    Review of Systems  Constitutional: Negative.   HENT: Negative.   Eyes: Negative.   Respiratory: Negative.   Cardiovascular: Negative.   Gastrointestinal: Negative.   Endocrine: Negative.   Genitourinary: Negative.   Musculoskeletal: Negative.   Skin: Negative.   Neurological: Negative.   Psychiatric/Behavioral: Negative.      Objective:  BP 124/78   Pulse 89   Temp (!) 97.4 F (36.3 C)   Resp 16   Ht 5\' 11"  (1.803 m)   Wt 210 lb 12.8 oz (95.6 kg)   SpO2 97%   BMI 29.40 kg/m   BP/Weight 01/28/2020 01/06/2020 04/13/2015  Systolic BP 124 106 118  Diastolic BP 78 72 76  Wt. (Lbs) 210.8 210 -  BMI 29.4 31.93 -    Physical Exam Vitals reviewed.  Constitutional:      Appearance: Normal appearance.  HENT:     Head: Normocephalic and atraumatic.     Right Ear: Tympanic membrane, ear canal and external ear normal.     Left Ear: Tympanic membrane, ear canal and external ear normal.     Nose: Nose normal.     Mouth/Throat:     Mouth: Mucous membranes are dry.  Eyes:     Extraocular Movements: Extraocular movements intact.     Conjunctiva/sclera: Conjunctivae normal.     Pupils: Pupils are equal, round, and reactive to light.  Cardiovascular:     Rate and Rhythm: Normal rate and regular rhythm.     Pulses: Normal pulses.     Heart sounds:  Normal heart sounds.  Pulmonary:     Effort: Pulmonary effort is normal.     Breath sounds: Normal breath sounds.  Abdominal:     General: Abdomen is flat. Bowel sounds are normal.     Palpations: Abdomen is soft.  Musculoskeletal:        General: Normal range of motion.  Cervical back: Normal range of motion and neck supple.  Skin:    General: Skin is warm and dry.     Capillary Refill: Capillary refill takes less than 2 seconds.  Neurological:     General: No focal deficit present.     Mental Status: He is alert and oriented to person, place, and time. Mental status is at baseline.  Psychiatric:        Mood and Affect: Mood normal.        Behavior: Behavior normal.        Thought Content: Thought content normal.        Judgment: Judgment normal.     No results found for: WBC, HGB, HCT, PLT, GLUCOSE, CHOL, TRIG, HDL, LDLDIRECT, LDLCALC, ALT, AST, NA, K, CL, CREATININE, BUN, CO2, TSH, PSA, INR, GLUF, HGBA1C, MICROALBUR    Assessment & Plan:  1. Encounter for annual physical exam - Comprehensive metabolic panel - Lipid panel - Hemoglobin A1c  Patient has normal physical for work, lab drawn  Body mass index is 29.4 kg/m.   These are the goals we discussed: Goals   None    An individualize plan was formulated for obesity using patient history and physical exam to encourage weight loss.  An evidence based program was formulated.  Patient is to cut portion size with meals and to plan physical exercise 3 days a week at least 20 minutes.  Weight watchers and other programs are helpful.  Planned amount of weight loss 10 lbs.  This is a list of the screening recommended for you and due dates:  Health Maintenance  Topic Date Due  .  Hepatitis C: One time screening is recommended by Center for Disease Control  (CDC) for  adults born from [birth year] through 1967.   Never done  . HIV Screening  Never done  . Tetanus Vaccine  Never done  . Flu Shot  03/01/2020  . COVID-19 Vaccine   Completed     AN INDIVIDUALIZED CARE PLAN: was established or reinforced today.   SELF MANAGEMENT: The patient and I together assessed ways to personally work towards obtaining the recommended goals  Support needs The patient and/or family needs were assessed and services were offered if appropriate.  No orders of the defined types were placed in this encounter.   Follow-up: Return if symptoms worsen or fail to improve.  An After Visit Summary was printed and given to the patient.  Brent Bulla [facility] [phone]

## 2020-01-28 NOTE — Patient Instructions (Signed)
Preventive Care 41-41 Years Old, Male Preventive care refers to lifestyle choices and visits with your health care provider that can promote health and wellness. This includes:  A yearly physical exam. This is also called an annual well check.  Regular dental and eye exams.  Immunizations.  Screening for certain conditions.  Healthy lifestyle choices, such as eating a healthy diet, getting regular exercise, not using drugs or products that contain nicotine and tobacco, and limiting alcohol use. What can I expect for my preventive care visit? Physical exam Your health care provider will check:  Height and weight. These may be used to calculate body mass index (BMI), which is a measurement that tells if you are at a healthy weight.  Heart rate and blood pressure.  Your skin for abnormal spots. Counseling Your health care provider may ask you questions about:  Alcohol, tobacco, and drug use.  Emotional well-being.  Home and relationship well-being.  Sexual activity.  Eating habits.  Work and work Statistician. What immunizations do I need?  Influenza (flu) vaccine  This is recommended every year. Tetanus, diphtheria, and pertussis (Tdap) vaccine  You may need a Td booster every 10 years. Varicella (chickenpox) vaccine  You may need this vaccine if you have not already been vaccinated. Zoster (shingles) vaccine  You may need this after age 64. Measles, mumps, and rubella (MMR) vaccine  You may need at least one dose of MMR if you were born in 1957 or later. You may also need a second dose. Pneumococcal conjugate (PCV13) vaccine  You may need this if you have certain conditions and were not previously vaccinated. Pneumococcal polysaccharide (PPSV23) vaccine  You may need one or two doses if you smoke cigarettes or if you have certain conditions. Meningococcal conjugate (MenACWY) vaccine  You may need this if you have certain conditions. Hepatitis A  vaccine  You may need this if you have certain conditions or if you travel or work in places where you may be exposed to hepatitis A. Hepatitis B vaccine  You may need this if you have certain conditions or if you travel or work in places where you may be exposed to hepatitis B. Haemophilus influenzae type b (Hib) vaccine  You may need this if you have certain risk factors. Human papillomavirus (HPV) vaccine  If recommended by your health care provider, you may need three doses over 6 months. You may receive vaccines as individual doses or as more than one vaccine together in one shot (combination vaccines). Talk with your health care provider about the risks and benefits of combination vaccines. What tests do I need? Blood tests  Lipid and cholesterol levels. These may be checked every 5 years, or more frequently if you are over 60 years old.  Hepatitis C test.  Hepatitis B test. Screening  Lung cancer screening. You may have this screening every year starting at age 43 if you have a 30-pack-year history of smoking and currently smoke or have quit within the past 15 years.  Prostate cancer screening. Recommendations will vary depending on your family history and other risks.  Colorectal cancer screening. All adults should have this screening starting at age 72 and continuing until age 2. Your health care provider may recommend screening at age 14 if you are at increased risk. You will have tests every 1-10 years, depending on your results and the type of screening test.  Diabetes screening. This is done by checking your blood sugar (glucose) after you have not eaten  for a while (fasting). You may have this done every 1-3 years.  Sexually transmitted disease (STD) testing. Follow these instructions at home: Eating and drinking  Eat a diet that includes fresh fruits and vegetables, whole grains, lean protein, and low-fat dairy products.  Take vitamin and mineral supplements as  recommended by your health care provider.  Do not drink alcohol if your health care provider tells you not to drink.  If you drink alcohol: ? Limit how much you have to 0-2 drinks a day. ? Be aware of how much alcohol is in your drink. In the [place] one drink equals one 12 oz bottle of beer (355 mL), one 5 oz glass of wine (148 mL), or one 1 oz glass of hard liquor (44 mL). Lifestyle  Take daily care of your teeth and gums.  Stay active. Exercise for at least 30 minutes on 5 or more days each week.  Do not use any products that contain nicotine or tobacco, such as cigarettes, e-cigarettes, and chewing tobacco. If you need help quitting, ask your health care provider.  If you are sexually active, practice safe sex. Use a condom or other form of protection to prevent STIs (sexually transmitted infections).  Talk with your health care provider about taking a low-dose aspirin every day starting at age 53. What's next?  Go to your health care provider once a year for a well check visit.  Ask your health care provider how often you should have your eyes and teeth checked.  Stay up to date on all vaccines. This information is not intended to replace advice given to you by your health care provider. Make sure you discuss any questions you have with your health care provider. Document Revised: 07/12/2018 Document Reviewed: 07/12/2018 Elsevier Patient Education  2020 [employer].

## 2020-01-30 ENCOUNTER — Other Ambulatory Visit: Payer: Self-pay

## 2020-01-30 DIAGNOSIS — E875 Hyperkalemia: Secondary | ICD-10-CM

## 2020-01-30 NOTE — Progress Notes (Signed)
m °

## 2020-01-30 NOTE — Progress Notes (Signed)
Glucose 103, potassium 5.4 high, recheck 1 week, LDL 122, A1c 5.3  He needs low cholesterol diet lp

## 2020-01-31 LAB — COMPREHENSIVE METABOLIC PANEL
ALT: 39 IU/L (ref 0–44)
AST: 25 IU/L (ref 0–40)
Albumin/Globulin Ratio: 1.8 (ref 1.2–2.2)
Albumin: 4.7 g/dL (ref 4.0–5.0)
Alkaline Phosphatase: 144 IU/L — ABNORMAL HIGH (ref 48–121)
BUN/Creatinine Ratio: 10 (ref 9–20)
BUN: 11 mg/dL (ref 6–24)
Bilirubin Total: 0.6 mg/dL (ref 0.0–1.2)
CO2: 25 mmol/L (ref 20–29)
Calcium: 9.9 mg/dL (ref 8.7–10.2)
Chloride: 98 mmol/L (ref 96–106)
Creatinine, Ser: 1.07 mg/dL (ref 0.76–1.27)
GFR calc Af Amer: 100 mL/min/{1.73_m2} (ref >59)
GFR calc non Af Amer: 86 mL/min/{1.73_m2} (ref >59)
Globulin, Total: 2.6 g/dL (ref 1.5–4.5)
Glucose: 103 mg/dL — ABNORMAL HIGH (ref 65–99)
Potassium: 5.4 mmol/L — ABNORMAL HIGH (ref 3.5–5.2)
Sodium: 136 mmol/L (ref 134–144)
Total Protein: 7.3 g/dL (ref 6.0–8.5)

## 2020-01-31 LAB — HEMOGLOBIN A1C
Est. average glucose Bld gHb Est-mCnc: 105 mg/dL
Hgb A1c MFr Bld: 5.3 % (ref 4.8–5.6)

## 2020-01-31 LAB — LIPID PANEL
Chol/HDL Ratio: 5 ratio (ref 0.0–5.0)
Cholesterol, Total: 181 mg/dL (ref 100–199)
HDL: 36 mg/dL — ABNORMAL LOW (ref >39)
LDL Chol Calc (NIH): 122 mg/dL — ABNORMAL HIGH (ref 0–99)
Triglycerides: 128 mg/dL (ref 0–149)
VLDL Cholesterol Cal: 23 mg/dL (ref 5–40)

## 2020-01-31 LAB — CARDIOVASCULAR RISK ASSESSMENT

## 2020-02-06 ENCOUNTER — Other Ambulatory Visit: Payer: BC Managed Care – PPO

## 2020-02-06 ENCOUNTER — Other Ambulatory Visit: Payer: Self-pay

## 2020-02-06 DIAGNOSIS — R079 Chest pain, unspecified: Secondary | ICD-10-CM | POA: Diagnosis not present

## 2020-02-06 DIAGNOSIS — I10 Essential (primary) hypertension: Secondary | ICD-10-CM | POA: Diagnosis not present

## 2020-02-06 DIAGNOSIS — Z6829 Body mass index (BMI) 29.0-29.9, adult: Secondary | ICD-10-CM | POA: Diagnosis not present

## 2020-02-06 DIAGNOSIS — R42 Dizziness and giddiness: Secondary | ICD-10-CM | POA: Diagnosis not present

## 2020-02-06 DIAGNOSIS — Z880 Allergy status to penicillin: Secondary | ICD-10-CM | POA: Diagnosis not present

## 2020-02-06 DIAGNOSIS — R519 Headache, unspecified: Secondary | ICD-10-CM | POA: Diagnosis not present

## 2020-02-06 DIAGNOSIS — E875 Hyperkalemia: Secondary | ICD-10-CM

## 2020-02-06 DIAGNOSIS — Z9049 Acquired absence of other specified parts of digestive tract: Secondary | ICD-10-CM | POA: Diagnosis not present

## 2020-02-06 DIAGNOSIS — R0789 Other chest pain: Secondary | ICD-10-CM | POA: Diagnosis not present

## 2020-02-06 LAB — COMPREHENSIVE METABOLIC PANEL
ALT: 39 IU/L (ref 0–44)
AST: 27 IU/L (ref 0–40)
Albumin/Globulin Ratio: 1.7 (ref 1.2–2.2)
Albumin: 4.6 g/dL (ref 4.0–5.0)
Alkaline Phosphatase: 122 IU/L — ABNORMAL HIGH (ref 48–121)
BUN/Creatinine Ratio: 10 (ref 9–20)
BUN: 10 mg/dL (ref 6–24)
Bilirubin Total: 0.6 mg/dL (ref 0.0–1.2)
CO2: 25 mmol/L (ref 20–29)
Calcium: 9.6 mg/dL (ref 8.7–10.2)
Chloride: 98 mmol/L (ref 96–106)
Creatinine, Ser: 1.04 mg/dL (ref 0.76–1.27)
GFR calc Af Amer: 103 mL/min/{1.73_m2} (ref >59)
GFR calc non Af Amer: 89 mL/min/{1.73_m2} (ref >59)
Globulin, Total: 2.7 g/dL (ref 1.5–4.5)
Glucose: 103 mg/dL — ABNORMAL HIGH (ref 65–99)
Potassium: 5 mmol/L (ref 3.5–5.2)
Sodium: 138 mmol/L (ref 134–144)
Total Protein: 7.3 g/dL (ref 6.0–8.5)

## 2020-02-07 NOTE — Progress Notes (Signed)
Glucose 103, kidney and liver tests normal, potassium normal lp

## 2020-02-17 DIAGNOSIS — M5136 Other intervertebral disc degeneration, lumbar region: Secondary | ICD-10-CM | POA: Diagnosis not present

## 2020-02-17 DIAGNOSIS — M5441 Lumbago with sciatica, right side: Secondary | ICD-10-CM | POA: Diagnosis not present

## 2020-02-17 DIAGNOSIS — M62830 Muscle spasm of back: Secondary | ICD-10-CM | POA: Diagnosis not present

## 2020-02-17 DIAGNOSIS — M546 Pain in thoracic spine: Secondary | ICD-10-CM | POA: Diagnosis not present

## 2020-02-19 ENCOUNTER — Other Ambulatory Visit: Payer: Self-pay

## 2020-02-19 ENCOUNTER — Encounter: Payer: Self-pay | Admitting: Legal Medicine

## 2020-02-19 ENCOUNTER — Ambulatory Visit: Payer: BC Managed Care – PPO | Admitting: Legal Medicine

## 2020-02-19 VITALS — BP 140/88 | HR 102 | Temp 97.9°F | Resp 16 | Ht 71.0 in | Wt 209.2 lb

## 2020-02-19 DIAGNOSIS — G43009 Migraine without aura, not intractable, without status migrainosus: Secondary | ICD-10-CM | POA: Insufficient documentation

## 2020-02-19 DIAGNOSIS — J301 Allergic rhinitis due to pollen: Secondary | ICD-10-CM | POA: Diagnosis not present

## 2020-02-19 DIAGNOSIS — J309 Allergic rhinitis, unspecified: Secondary | ICD-10-CM | POA: Insufficient documentation

## 2020-02-19 DIAGNOSIS — G43709 Chronic migraine without aura, not intractable, without status migrainosus: Secondary | ICD-10-CM

## 2020-02-19 MED ORDER — SUMATRIPTAN SUCCINATE 50 MG PO TABS: 50.0000 mg | ORAL_TABLET | ORAL | 0 refills | Status: DC | PRN

## 2020-02-19 MED ORDER — PROPRANOLOL HCL ER 120 MG PO CP24: 120.0000 mg | ORAL_CAPSULE | Freq: Every day | ORAL | 2 refills | Status: DC

## 2020-02-19 MED ORDER — TRIAMCINOLONE ACETONIDE 40 MG/ML IJ SUSP
40.0000 mg | Freq: Once | INTRAMUSCULAR | Status: AC
Start: 2020-02-19 — End: 2020-02-19
  Administered 2020-02-19: 40 mg via INTRAMUSCULAR

## 2020-02-19 NOTE — Patient Instructions (Signed)

## 2020-02-19 NOTE — Progress Notes (Signed)
Acute Office Visit  Subjective:    Patient ID: George Grant, male    DOB: 11/25/1980, 39 y.o.   MRN: [id number]  Chief Complaint  Patient presents with  . Headache    HPI Patient is in today for headaches for 3 months, does not wake him at night.  No nausea or vomiting.He gets poor balance and bp increased.  BP increased in low pressure weather.Headache all top of head and migraine tyep on left eye.no aura.but sees black spots with headache.  He has sinus problems. Headache started after his concussion.  Past Medical History:  Diagnosis Date  . Asthma   . Concussion without loss of consciousness   . GERD (gastroesophageal reflux disease)   . Sinus problem     Past Surgical History:  Procedure Laterality Date  . CHOLECYSTECTOMY      Family History  Problem Relation Age of Onset  . Skin cancer Mother   . Skin cancer Father     Social History   Socioeconomic History  . Marital status: Married    Spouse name: Not on file  . Number of children: Not on file  . Years of education: Not on file  . Highest education level: Not on file  Occupational History  . Not on file  Tobacco Use  . Smoking status: Never Smoker  . Smokeless tobacco: Never Used  Vaping Use  . Vaping Use: Never used  Substance and Sexual Activity  . Alcohol use: Yes  . Drug use: No  . Sexual activity: Not on file  Other Topics Concern  . Not on file  Social History Narrative  . Not on file   Social Determinants of Health   Financial Resource Strain:   . Difficulty of Paying Living Expenses:   Food Insecurity:   . Worried About Programme researcher, broadcasting/film/video in the Last Year:   . Barista in the Last Year:   Transportation Needs:   . Freight forwarder (Medical):   Marland Kitchen Lack of Transportation (Non-Medical):   Physical Activity:   . Days of Exercise per Week:   . Minutes of Exercise per Session:   Stress:   . Feeling of Stress :   Social Connections:   . Frequency of Communication with  Friends and Family:   . Frequency of Social Gatherings with Friends and Family:   . Attends Religious Services:   . Active Member of Clubs or Organizations:   . Attends Banker Meetings:   Marland Kitchen Marital Status:   Intimate Partner Violence:   . Fear of Current or Ex-Partner:   . Emotionally Abused:   Marland Kitchen Physically Abused:   . Sexually Abused:     Outpatient Medications Prior to Visit  Medication Sig Dispense Refill  . famotidine (PEPCID) 40 MG tablet TAKE 1 TABLET BY MOUTH UP TO TWICE DAILY AS NEEDED FOR BREAKTHROUGH HEARTBURN    . fexofenadine (ALLEGRA) 180 MG tablet Take by mouth.    . pantoprazole (PROTONIX) 40 MG tablet Take 40 mg by mouth daily.     No facility-administered medications prior to visit.    Allergies  Allergen Reactions  . Amoxicillin Hives  . Doxycycline   . Penicillin G Hives  . Penicillins     amoxicillin  . Septra [Sulfamethoxazole-Trimethoprim] Hives    Review of Systems  Constitutional: Negative.   HENT: Positive for sinus pain.   Eyes: Negative.   Respiratory: Negative.   Gastrointestinal: Negative.   Genitourinary: Negative.  Musculoskeletal: Negative.   Skin: Negative.   Neurological: Positive for dizziness.  Psychiatric/Behavioral: Negative.        Objective:    Physical Exam Vitals reviewed.  Constitutional:      Appearance: Normal appearance. He is well-developed.  HENT:     Head: Normocephalic.     Right Ear: Tympanic membrane, ear canal and external ear normal.     Left Ear: Tympanic membrane, ear canal and external ear normal.     Nose: Nose normal.     Mouth/Throat:     Mouth: Mucous membranes are moist.  Eyes:     Extraocular Movements: Extraocular movements intact.     Pupils: Pupils are equal, round, and reactive to light.  Cardiovascular:     Rate and Rhythm: Normal rate and regular rhythm.     Pulses: Normal pulses.     Heart sounds: Normal heart sounds.  Pulmonary:     Effort: Pulmonary effort is  normal.     Breath sounds: Normal breath sounds.  Abdominal:     General: Bowel sounds are normal.     Palpations: Abdomen is soft.  Musculoskeletal:        General: Normal range of motion.     Cervical back: Normal range of motion and neck supple.  Skin:    General: Skin is warm and dry.     Capillary Refill: Capillary refill takes less than 2 seconds.  Neurological:     Mental Status: He is alert.     BP 140/88   Pulse (!) 102   Temp 97.9 F (36.6 C)   Resp 16   Ht 5\' 11"  (1.803 m)   Wt 209 lb 3.2 oz (94.9 kg)   SpO2 97%   BMI 29.18 kg/m  Wt Readings from Last 3 Encounters:  02/19/20 209 lb 3.2 oz (94.9 kg)  01/28/20 210 lb 12.8 oz (95.6 kg)  01/06/20 210 lb (95.3 kg)    Health Maintenance Due  Topic Date Due  . Hepatitis C Screening  Never done  . HIV Screening  Never done  . TETANUS/TDAP  Never done    There are no preventive care reminders to display for this patient.   No results found for: TSH No results found for: WBC, HGB, HCT, MCV, PLT Lab Results  Component Value Date   NA 138 02/06/2020   K 5.0 02/06/2020   CO2 25 02/06/2020   GLUCOSE 103 (H) 02/06/2020   BUN 10 02/06/2020   CREATININE 1.04 02/06/2020   BILITOT 0.6 02/06/2020   ALKPHOS 122 (H) 02/06/2020   AST 27 02/06/2020   ALT 39 02/06/2020   PROT 7.3 02/06/2020   ALBUMIN 4.6 02/06/2020   CALCIUM 9.6 02/06/2020   Lab Results  Component Value Date   CHOL 181 01/28/2020   Lab Results  Component Value Date   HDL 36 (L) 01/28/2020   Lab Results  Component Value Date   LDLCALC 122 (H) 01/28/2020   Lab Results  Component Value Date   TRIG 128 01/28/2020   Lab Results  Component Value Date   CHOLHDL 5.0 01/28/2020   Lab Results  Component Value Date   HGBA1C 5.3 01/28/2020       Assessment & Plan:  1. Chronic migraine without aura without status migrainosus, not intractable - propranolol ER (INDERAL LA) 120 MG 24 hr capsule; Take 1 capsule (120 mg total) by mouth daily.   Dispense: 90 capsule; Refill: 2 - SUMAtriptan (IMITREX) 50 MG tablet; Take 1  tablet (50 mg total) by mouth every 2 (two) hours as needed for migraine. May repeat in 2 hours if headache persists or recurs.  Dispense: 10 tablet; Refill: 0 New onset migraines intermixed with sinus headaches and tension.  We discussed migraines and will start a beta blocker will help his BP. Start immitrex for heacajes 2. Seasonal allergic rhinitis due to pollen - triamcinolone acetonide (KENALOG-40) injection 40 mg Treat sinus congestion with kenalog to help the sinus type headaches    Meds ordered this encounter  Medications  . propranolol ER (INDERAL LA) 120 MG 24 hr capsule    Sig: Take 1 capsule (120 mg total) by mouth daily.    Dispense:  90 capsule    Refill:  2  . SUMAtriptan (IMITREX) 50 MG tablet    Sig: Take 1 tablet (50 mg total) by mouth every 2 (two) hours as needed for migraine. May repeat in 2 hours if headache persists or recurs.    Dispense:  10 tablet    Refill:  0  . triamcinolone acetonide (KENALOG-40) injection 40 mg    No orders of the defined types were placed in this encounter.    Follow-up: Return in about 1 month (around 03/21/2020) for migraine.  An After Visit Summary was printed and given to the patient.  Brent Bulla [facility] [phone]

## 2020-02-21 ENCOUNTER — Other Ambulatory Visit: Payer: Self-pay

## 2020-02-21 ENCOUNTER — Ambulatory Visit: Payer: BC Managed Care – PPO | Admitting: Legal Medicine

## 2020-02-21 ENCOUNTER — Encounter: Payer: Self-pay | Admitting: Legal Medicine

## 2020-02-21 VITALS — BP 142/76 | HR 74 | Temp 97.7°F | Resp 17 | Ht 71.0 in | Wt 206.6 lb

## 2020-02-21 DIAGNOSIS — G43009 Migraine without aura, not intractable, without status migrainosus: Secondary | ICD-10-CM | POA: Diagnosis not present

## 2020-02-21 DIAGNOSIS — I10 Essential (primary) hypertension: Secondary | ICD-10-CM | POA: Diagnosis not present

## 2020-02-21 MED ORDER — PROPRANOLOL HCL 20 MG PO TABS: 20.0000 mg | ORAL_TABLET | Freq: Three times a day (TID) | ORAL | 3 refills | Status: DC

## 2020-02-21 NOTE — Progress Notes (Signed)
Subjective:  Patient ID: George Grant, male    DOB: [birth year]  Age: 39 y.o. MRN: [id number]  Chief Complaint  Patient presents with  . Allergic Reaction    Patient took propranolol and he felt numbness on the face, blurred vision, sweating, chills.    HPI: Patient had advesre reaction to propranolol, he got bradycardia and flushed and felt bad.  Stop propranol. 120 mg is too high.  He continues to have borderline hypertensiona nd has migraines and beta blocker used for prophylaxis.   Current Outpatient Medications on File Prior to Visit  Medication Sig Dispense Refill  . famotidine (PEPCID) 40 MG tablet TAKE 1 TABLET BY MOUTH UP TO TWICE DAILY AS NEEDED FOR BREAKTHROUGH HEARTBURN    . fexofenadine (ALLEGRA) 180 MG tablet Take by mouth.    . pantoprazole (PROTONIX) 40 MG tablet Take 40 mg by mouth daily.    . SUMAtriptan (IMITREX) 50 MG tablet Take 1 tablet (50 mg total) by mouth every 2 (two) hours as needed for migraine. May repeat in 2 hours if headache persists or recurs. 10 tablet 0   No current facility-administered medications on file prior to visit.   Past Medical History:  Diagnosis Date  . Asthma   . Concussion without loss of consciousness   . GERD (gastroesophageal reflux disease)   . Hypertension   . Sinus problem    Past Surgical History:  Procedure Laterality Date  . CHOLECYSTECTOMY      Family History  Problem Relation Age of Onset  . Skin cancer Mother   . Skin cancer Father    Social History   Socioeconomic History  . Marital status: Married    Spouse name: Not on file  . Number of children: Not on file  . Years of education: Not on file  . Highest education level: Not on file  Occupational History  . Not on file  Tobacco Use  . Smoking status: Never Smoker  . Smokeless tobacco: Never Used  Vaping Use  . Vaping Use: Never used  Substance and Sexual Activity  . Alcohol use: Yes    Alcohol/week: 1.0 standard drink    Types: 1 Cans of beer per  week    Comment: socially  . Drug use: No  . Sexual activity: Yes    Partners: Female  Other Topics Concern  . Not on file  Social History Narrative  . Not on file   Social Determinants of Health   Financial Resource Strain:   . Difficulty of Paying Living Expenses:   Food Insecurity:   . Worried About Programme researcher, broadcasting/film/video in the Last Year:   . Barista in the Last Year:   Transportation Needs:   . Freight forwarder (Medical):   Marland Kitchen Lack of Transportation (Non-Medical):   Physical Activity:   . Days of Exercise per Week:   . Minutes of Exercise per Session:   Stress:   . Feeling of Stress :   Social Connections:   . Frequency of Communication with Friends and Family:   . Frequency of Social Gatherings with Friends and Family:   . Attends Religious Services:   . Active Member of Clubs or Organizations:   . Attends Banker Meetings:   Marland Kitchen Marital Status:     Review of Systems  Constitutional: Negative.   HENT: Negative.   Eyes: Negative.   Respiratory: Negative.   Cardiovascular: Negative.   Gastrointestinal: Negative.   Endocrine: Negative.  Genitourinary: Negative.   Musculoskeletal: Negative.   Neurological: Negative.   Psychiatric/Behavioral: Negative.      Objective:  BP (!) 142/76 (BP Location: Right Arm, Patient Position: Sitting)   Pulse 74   Temp 97.7 F (36.5 C) (Temporal)   Resp 17   Ht 5\' 11"  (1.803 m)   Wt (!) 206 lb 9.6 oz (93.7 kg)   BMI 28.81 kg/m   BP/Weight 02/21/2020 02/19/2020 01/28/2020  Systolic BP 142 140 124  Diastolic BP 76 88 78  Wt. (Lbs) 206.6 209.2 210.8  BMI 28.81 29.18 29.4    Physical Exam Vitals reviewed.  Constitutional:      Appearance: Normal appearance.  Eyes:     Extraocular Movements: Extraocular movements intact.     Conjunctiva/sclera: Conjunctivae normal.     Pupils: Pupils are equal, round, and reactive to light.  Cardiovascular:     Rate and Rhythm: Normal rate and regular rhythm.       Pulses: Normal pulses.     Heart sounds: Normal heart sounds.  Pulmonary:     Effort: Pulmonary effort is normal.     Breath sounds: Normal breath sounds.  Neurological:     Mental Status: He is alert.       Lab Results  Component Value Date   GLUCOSE 103 (H) 02/06/2020   CHOL 181 01/28/2020   TRIG 128 01/28/2020   HDL 36 (L) 01/28/2020   LDLCALC 122 (H) 01/28/2020   ALT 39 02/06/2020   AST 27 02/06/2020   NA 138 02/06/2020   K 5.0 02/06/2020   CL 98 02/06/2020   CREATININE 1.04 02/06/2020   BUN 10 02/06/2020   CO2 25 02/06/2020   HGBA1C 5.3 01/28/2020      Assessment & Plan:   1. Migraine without aura and without status migrainosus, not intractable - propranolol (INDERAL) 20 MG tablet; Take 1 tablet (20 mg total) by mouth 3 (three) times daily.  Dispense: 30 tablet; Refill: 3 AN INDIVIDUAL CARE PLAN was established and reinforced today.  The patient's status was assessed using clinical findings on exam, labs, and other diagnostic testing. Patient's success at meeting treatment goals based on disease specific evidence-bassed guidelines and found to be in poor control. RECOMMENDATIONS include change Beta blocker present medicines and treatment.Patient was started on too high dose of propranolol and was weak and felt bacd, now start lower dose 2. Essential hypertension, benign An individual hypertension care plan was established and reinforced today.  The patient's status was assessed using clinical findings on exam and labs or diagnostic tests. The patient's success at meeting treatment goals on disease specific evidence-based guidelines and found to be fair controlled. SELF MANAGEMENT: The patient and I together assessed ways to personally work towards obtaining the recommended goals. RECOMMENDATIONS: avoid decongestants found in common cold remedies, decrease consumption of alcohol, perform routine monitoring of BP with home BP cuff, exercise, reduction of dietary salt,  take medicines as prescribed, try not to miss doses and quit smoking.  Regular exercise and maintaining a healthy weight is needed.  Stress reduction may help. A CLINICAL SUMMARY including written plan identify barriers to care unique to individual due to social or financial issues.  We attempt to mutually creat solutions for individual and family understanding.    Meds ordered this encounter  Medications  . propranolol (INDERAL) 20 MG tablet    Sig: Take 1 tablet (20 mg total) by mouth 3 (three) times daily.    Dispense:  30 tablet  Refill:  3       Follow-up: Return in about 1 month (around 03/23/2020) for headache.  An After Visit Summary was printed and given to the patient.  [facility]rent Bull[facility] [phone]

## 2020-02-23 DIAGNOSIS — I10 Essential (primary) hypertension: Secondary | ICD-10-CM | POA: Insufficient documentation

## 2020-02-26 ENCOUNTER — Telehealth: Payer: Self-pay

## 2020-02-26 NOTE — Telephone Encounter (Signed)
Patient called in and is having another headache, patient BP is 150/90. He is asking what else he can do. He is leaving work due to not being able to concentate.

## 2020-02-26 NOTE — Telephone Encounter (Signed)
Take imitrex for acute migraine, if he needs, he can come in for an injection toradal lp

## 2020-03-09 ENCOUNTER — Telehealth: Payer: Self-pay

## 2020-03-09 ENCOUNTER — Other Ambulatory Visit: Payer: Self-pay | Admitting: Legal Medicine

## 2020-03-09 DIAGNOSIS — Z880 Allergy status to penicillin: Secondary | ICD-10-CM | POA: Diagnosis not present

## 2020-03-09 DIAGNOSIS — G8929 Other chronic pain: Secondary | ICD-10-CM | POA: Diagnosis not present

## 2020-03-09 DIAGNOSIS — I10 Essential (primary) hypertension: Secondary | ICD-10-CM | POA: Diagnosis not present

## 2020-03-09 DIAGNOSIS — G43109 Migraine with aura, not intractable, without status migrainosus: Secondary | ICD-10-CM | POA: Diagnosis not present

## 2020-03-09 DIAGNOSIS — M545 Low back pain: Secondary | ICD-10-CM | POA: Diagnosis not present

## 2020-03-09 DIAGNOSIS — Z9049 Acquired absence of other specified parts of digestive tract: Secondary | ICD-10-CM | POA: Diagnosis not present

## 2020-03-09 DIAGNOSIS — R42 Dizziness and giddiness: Secondary | ICD-10-CM | POA: Diagnosis not present

## 2020-03-09 DIAGNOSIS — R110 Nausea: Secondary | ICD-10-CM | POA: Diagnosis not present

## 2020-03-09 DIAGNOSIS — R202 Paresthesia of skin: Secondary | ICD-10-CM | POA: Diagnosis not present

## 2020-03-09 NOTE — Telephone Encounter (Signed)
Increase propranolol to 60mg  a day, usually need 80mg  to help lp

## 2020-03-09 NOTE — Telephone Encounter (Signed)
Patient had a BP of 150/90 and patient state he does not think anything is really getting better. States he is getting a migraine and is taking the medicine and does not do much. Patient states he has appointment on the 20th but doesn't think current regimen is working.

## 2020-03-12 DIAGNOSIS — R519 Headache, unspecified: Secondary | ICD-10-CM | POA: Diagnosis not present

## 2020-03-12 DIAGNOSIS — Z6828 Body mass index (BMI) 28.0-28.9, adult: Secondary | ICD-10-CM | POA: Diagnosis not present

## 2020-03-20 ENCOUNTER — Ambulatory Visit: Payer: BC Managed Care – PPO | Admitting: Legal Medicine

## 2020-03-23 DIAGNOSIS — R200 Anesthesia of skin: Secondary | ICD-10-CM | POA: Diagnosis not present

## 2020-03-23 DIAGNOSIS — R519 Headache, unspecified: Secondary | ICD-10-CM | POA: Diagnosis not present

## 2020-03-23 DIAGNOSIS — R9082 White matter disease, unspecified: Secondary | ICD-10-CM | POA: Diagnosis not present

## 2020-03-23 DIAGNOSIS — R29810 Facial weakness: Secondary | ICD-10-CM | POA: Diagnosis not present

## 2020-03-31 DIAGNOSIS — M5441 Lumbago with sciatica, right side: Secondary | ICD-10-CM | POA: Diagnosis not present

## 2020-03-31 DIAGNOSIS — M5136 Other intervertebral disc degeneration, lumbar region: Secondary | ICD-10-CM | POA: Diagnosis not present

## 2020-03-31 DIAGNOSIS — M546 Pain in thoracic spine: Secondary | ICD-10-CM | POA: Diagnosis not present

## 2020-03-31 DIAGNOSIS — M62830 Muscle spasm of back: Secondary | ICD-10-CM | POA: Diagnosis not present

## 2020-04-16 DIAGNOSIS — Z20828 Contact with and (suspected) exposure to other viral communicable diseases: Secondary | ICD-10-CM | POA: Diagnosis not present

## 2020-04-16 DIAGNOSIS — R05 Cough: Secondary | ICD-10-CM | POA: Diagnosis not present

## 2020-04-28 DIAGNOSIS — M5136 Other intervertebral disc degeneration, lumbar region: Secondary | ICD-10-CM | POA: Diagnosis not present

## 2020-04-28 DIAGNOSIS — M5441 Lumbago with sciatica, right side: Secondary | ICD-10-CM | POA: Diagnosis not present

## 2020-04-28 DIAGNOSIS — M62830 Muscle spasm of back: Secondary | ICD-10-CM | POA: Diagnosis not present

## 2020-04-28 DIAGNOSIS — M546 Pain in thoracic spine: Secondary | ICD-10-CM | POA: Diagnosis not present

## 2020-05-14 ENCOUNTER — Encounter: Payer: Self-pay | Admitting: Neurology

## 2020-05-14 ENCOUNTER — Other Ambulatory Visit: Payer: Self-pay

## 2020-05-14 ENCOUNTER — Ambulatory Visit: Payer: BC Managed Care – PPO | Admitting: Neurology

## 2020-05-14 VITALS — BP 134/94 | HR 89 | Ht 71.0 in | Wt 208.3 lb

## 2020-05-14 DIAGNOSIS — G43009 Migraine without aura, not intractable, without status migrainosus: Secondary | ICD-10-CM | POA: Diagnosis not present

## 2020-05-14 DIAGNOSIS — G4486 Cervicogenic headache: Secondary | ICD-10-CM

## 2020-05-14 DIAGNOSIS — R0683 Snoring: Secondary | ICD-10-CM

## 2020-05-14 DIAGNOSIS — G44209 Tension-type headache, unspecified, not intractable: Secondary | ICD-10-CM

## 2020-05-14 DIAGNOSIS — R519 Headache, unspecified: Secondary | ICD-10-CM

## 2020-05-14 DIAGNOSIS — G4719 Other hypersomnia: Secondary | ICD-10-CM | POA: Diagnosis not present

## 2020-05-14 MED ORDER — AMITRIPTYLINE HCL 25 MG PO TABS: ORAL_TABLET | ORAL | 3 refills | Status: DC

## 2020-05-14 NOTE — Progress Notes (Signed)
Subjective:    Patient ID: George Grant is a 39 y.o. male.  HPI     Huston Foley, MD, PhD [facility] [address], [address] [address] [place], [place] [address]  Dear Dr. Leonor Liv,   I saw your patient, George Grant, upon your kind request in my neurologic clinic today for initial consultation of his recurrent headaches.  The patient is unaccompanied today.  As you know, George Grant is a 39 year old right-handed gentleman with an underlying medical history of reflux disease, allergic rhinitis, asthma, and overweight state, who reports recurrent headaches for the past several months.  I reviewed your office note from 03/12/2020. You ordered brain imaging. He has been on sumatriptan as needed.  He presented to [facility] emergency department on 03/09/2020 and I reviewed the encounter records.  He was treated symptomatically with Toradol, and Zofran.  He has been on Inderal. He stopped the propranolol secondary to low heart rate.  He had a brain MRI with and without contrast through [facility] on 03/20/2020 and I reviewed the results: Impression: No acute intracranial abnormality or mass.  Minimal cerebral white matter T2 signal changes, nonspecific though may reflect the sequelae of migraines, early chronic small vessel ischemia or prior infection/inflammation. He reports pain around the left eye often.  He also has neck pain which sometimes radiates up from the back.  He reports having experienced different types of headaches.  His migraines are typically on the left side, behind the left eye and above the left eye and associated with light sensitivity and nausea, typically no vomiting.  He typically does not have any visual aura but has had recurrent floaters.  He had a routine eye examination and is actually pending for his yearly eye exam later this month.  He wears contact lenses.  He also has experienced neck pain.  He has a longstanding history of low back pain and sees a  chiropractor about once a month and noticed radiating pain from the neck upwards in the back of the head.  He has also noticed a more pressure like sensation across the forehead intermittently and at this juncture in the past few weeks he has had a nearly daily headache and is also woken up with a headache.  Of note, he does snore and the snoring has been bothersome to his wife from time to time.  He has an Epworth sleepiness score of 11 out of 24 today.  He does not have nightly nocturia.  He is not aware of any family history of migraines.  He has had occasional dizziness when standing.  He has not had any recent falls.  He denies any sudden onset of one-sided weakness or numbness or tingling or droopy face or slurring of speech but has had tingling around his mouth in both hands occasionally with a headache.  He does not drink caffeine daily.  He drinks alcohol very occasionally, he is a non-smoker.  He works as an Economist.  He lives with his wife and 37-year-old daughter, they have 2 dogs in the household.  He goes to bed around 9 or 10 and has a rise time of 5 AM.  He has started going to bed a little earlier than typical for him secondary to feeling more tired during the day and not feeling fully rested when he wakes up.  He has never had a sleep study but his wife has mentioned to him before that he should get checked out.  His Past Medical History Is Significant For: Past Medical History:  Diagnosis Date  . Anxiety   . Asthma   . Concussion without loss of consciousness   . GERD (gastroesophageal reflux disease)   . Hypertension   . Lower back pain   . Migraine   . Sinus problem     His Past Surgical History Is Significant For: Past Surgical History:  Procedure Laterality Date  . CHOLECYSTECTOMY      His Family History Is Significant For: Family History  Problem Relation Age of Onset  . Skin cancer Mother   . Heart attack Mother   . Skin cancer Father     His  Social History Is Significant For: Social History   Socioeconomic History  . Marital status: Married    Spouse name: Not on file  . Number of children: Not on file  . Years of education: Not on file  . Highest education level: Not on file  Occupational History  . Not on file  Tobacco Use  . Smoking status: Never Smoker  . Smokeless tobacco: Never Used  Vaping Use  . Vaping Use: Never used  Substance and Sexual Activity  . Alcohol use: Yes    Alcohol/week: 1.0 standard drink    Types: 1 Cans of beer per week    Comment: socially  . Drug use: No  . Sexual activity: Yes    Partners: Female  Other Topics Concern  . Not on file  Social History Narrative  . Not on file   Social Determinants of Health   Financial Resource Strain:   . Difficulty of Paying Living Expenses: Not on file  Food Insecurity:   . Worried About Programme researcher, broadcasting/film/video in the Last Year: Not on file  . Ran Out of Food in the Last Year: Not on file  Transportation Needs:   . Lack of Transportation (Medical): Not on file  . Lack of Transportation (Non-Medical): Not on file  Physical Activity:   . Days of Exercise per Week: Not on file  . Minutes of Exercise per Session: Not on file  Stress:   . Feeling of Stress : Not on file  Social Connections:   . Frequency of Communication with Friends and Family: Not on file  . Frequency of Social Gatherings with Friends and Family: Not on file  . Attends Religious Services: Not on file  . Active Member of Clubs or Organizations: Not on file  . Attends Banker Meetings: Not on file  . Marital Status: Not on file    His Allergies Are:  Allergies  Allergen Reactions  . Amoxicillin Hives  . Doxycycline   . Penicillin G Hives  . Penicillins     amoxicillin  . Septra [Sulfamethoxazole-Trimethoprim] Hives  :   His Current Medications Are:  Outpatient Encounter Medications as of 05/14/2020  Medication Sig  . Acetaminophen (TYLENOL PO) Take by  mouth. PRN for Headaches  . famotidine (PEPCID) 40 MG tablet TAKE 1 TABLET BY MOUTH UP TO TWICE DAILY AS NEEDED FOR BREAKTHROUGH HEARTBURN  . fexofenadine (ALLEGRA) 180 MG tablet Take by mouth.  . Multiple Vitamin (MULTIVITAMIN PO) Take by mouth.  . pantoprazole (PROTONIX) 40 MG tablet Take 40 mg by mouth daily.  . SUMAtriptan (IMITREX) 50 MG tablet Take 1 tablet (50 mg total) by mouth every 2 (two) hours as needed for migraine. May repeat in 2 hours if headache persists or recurs.  . [DISCONTINUED] propranolol (INDERAL) 20 MG tablet  Take 1 tablet (20 mg total) by mouth 3 (three) times daily.   No facility-administered encounter medications on file as of 05/14/2020.  :   Review of Systems:  Out of a complete 14 point review of systems, all are reviewed and negative with the exception of these symptoms as listed below:   Review of Systems  Neurological:       Here to discuss worsening h/a. Pt reports almost daily h/a and occasional migraines ( pain localized to the left eye). Pt reports dark floaters in his eyes, tingling sensations in legs/arms and dizziness associated with the h/a. Reports neck pain that radiates up the base of his skull.  Reports he typically takes tylenol extra strength along with sumatriptan 100 mg PRN  He reports he has tried Excedrin along with propranolol but d/c propranolol due low HR. Reports he had MRI  W/WO of the brain back in August of 2021 through [facility].     Objective:  Neurological Exam  Physical Exam Physical Examination:   Vitals:   05/14/20 0954  BP: (!) 134/94  Pulse: 89  SpO2: 97%    General Examination: The patient is a very pleasant 39 y.o. male in no acute distress. He appears well-developed and well-nourished and well groomed.   HEENT: Normocephalic, atraumatic, pupils are equal, round and reactive to light and accommodation. Funduscopic exam is normal with sharp disc margins noted. Extraocular tracking is good without  limitation to gaze excursion or nystagmus noted. Normal smooth pursuit is noted. Hearing is grossly intact. Face is symmetric with normal facial animation and normal facial sensation. Speech is clear with no dysarthria noted. There is no hypophonia. There is no lip, neck/head, jaw or voice tremor. Neck is supple with full range of passive and active motion. There are no carotid bruits on auscultation. Oropharynx exam reveals: mild mouth dryness, adequate dental hygiene and moderate airway crowding, due to smaller airway entry, Mallampati class III, tonsils on the smaller side but larger uvula noted.  Neck circumference of 17-1/2 inches.  Tongue protrudes centrally and palate elevates symmetrically.  Chest: Clear to auscultation without wheezing, rhonchi or crackles noted.  Heart: S1+S2+0, regular and normal without murmurs, rubs or gallops noted.   Abdomen: Soft, non-tender and non-distended with normal bowel sounds appreciated on auscultation.  Extremities: There is no pitting edema in the distal lower extremities bilaterally. Pedal pulses are intact.  Skin: Warm and dry without trophic changes noted. There are no varicose veins.  Musculoskeletal: exam reveals no obvious joint deformities, tenderness or joint swelling or erythema.   Neurologically:  Mental status: The patient is awake, alert and oriented in all 4 spheres. His immediate and remote memory, attention, language skills and fund of knowledge are appropriate. There is no evidence of aphasia, agnosia, apraxia or anomia. Speech is clear with normal prosody and enunciation. Thought process is linear. Mood is normal and affect is normal.  Cranial nerves II - XII are as described above under HEENT exam. In addition: shoulder shrug is normal with equal shoulder height noted. Motor exam: Normal bulk, strength and tone is noted. There is no drift, tremor or rebound. Romberg is negative. Reflexes are 2+ throughout. Babinski: Toes are flexor  bilaterally. Fine motor skills and coordination: intact with normal finger taps, normal hand movements, normal rapid alternating patting, normal foot taps and normal foot agility.  Cerebellar testing: No dysmetria or intention tremor on finger to nose testing. Heel to shin is unremarkable bilaterally. There is no truncal or  gait ataxia.  Sensory exam: intact to light touch, vibration, temperature sense in the upper and lower extremities.  Gait, station and balance: He stands easily. No veering to one side is noted. No leaning to one side is noted. Posture is age-appropriate and stance is narrow based. Gait shows normal stride length and normal pace. No problems turning are noted. Tandem walk is unremarkable.   Assessment and Plan:   In summary, George RenshawJeremy Grant is a very pleasant 39 y.o.-year old male with an underlying medical history of reflux disease, allergic rhinitis, asthma, and overweight state, who presents for evaluation of his recurrent headaches over the past several months.  His history and presentation are supportive of a combination headache disorder including migrainous features with left-sided migraine-like headaches associated with photophobia and nausea as well as tension headaches which are more in the bifrontal areas and cervicogenic headaches which are more on the back of his head radiating forward and start often in the neck area.  In addition, he may have headaches related to possible underlying sleep disordered breathing.  I had a long discussion with the patient regarding these possibilities.  For as needed use for abortive treatment he has had sumatriptan.  For migraine prevention he tried propranolol but did not feel good on it and also had heart rate reduction into the lower 60s he reports.  His neurological exam is nonfocal.  He is scheduled for a routine eye examination later this month.  He also had a recent brain MRI which was benign.  I suggested we proceed with sleep study  evaluation and consider CPAP therapy if he has underlying obstructive sleep apnea.  For headache prevention I suggested a trial of low-dose amitriptyline with gradual titration.  He is advised regarding the possible side effects and we talked about expectations and the possibility of utilizing a different abortive medication such as one of the newer migraine medications.  For now, he can continue using sumatriptan as needed.  We talked about headache triggers and the importance of good hydration, good rest and stress management.  He does endorse increase in stress both at work as he had to fill in for a Production designer, theatre/television/filmmanager and in his life because he recently lost his mother.   He is advised that we will call him from the sleep lab to schedule his sleep study.  I provided a new prescription for amitriptyline and written instructions.  He is advised to follow-up routinely in the next couple of months, sooner if needed.  I answered all his questions today and he was in agreement with the plan.  We may consider a cervical spine MRI down the road.   Thank you very much for allowing me to participate in the care of this nice patient. If I can be of any further assistance to you please do not hesitate to call me at [phone].  Sincerely,   Huston FoleySaima Obbie Lewallen, MD, PhD

## 2020-05-14 NOTE — Patient Instructions (Addendum)
I think you have developed a combination of headaches, including migraines, tension headaches from increased stress, neck related headaches which may cause cervicogenic headaches and possibly headaches from underlying sleep apnea.  I would like to proceed with a sleep study.  If you have obstructive sleep apnea, I will recommend a CPAP or AutoPap machine next.  On the positive side, you have a benign exam, you have an upcoming eye examination and your recent brain MRI was benign.  For headache prevention, I recommend you start Elavil (generic name: amitriptyline) 25 mg: Take half a pill daily at bedtime for one week, then one pill daily at bedtime for one week, then one and a half pills daily at bedtime for one week, then 2 pills daily at bedtime thereafter. Common side effects reported are: mouth dryness, drowsiness, confusion, dizziness.   We may consider a specific migraine medication for as needed use.  For now, you can use sumatriptan as needed.  We may consider a neck MRI at some point.  Please follow-up routinely in 3 months, sooner if needed.  We will keep you posted as to your sleep study.

## 2020-05-26 DIAGNOSIS — M5441 Lumbago with sciatica, right side: Secondary | ICD-10-CM | POA: Diagnosis not present

## 2020-05-26 DIAGNOSIS — M546 Pain in thoracic spine: Secondary | ICD-10-CM | POA: Diagnosis not present

## 2020-05-26 DIAGNOSIS — M62830 Muscle spasm of back: Secondary | ICD-10-CM | POA: Diagnosis not present

## 2020-05-26 DIAGNOSIS — M5136 Other intervertebral disc degeneration, lumbar region: Secondary | ICD-10-CM | POA: Diagnosis not present

## 2020-06-02 ENCOUNTER — Ambulatory Visit (INDEPENDENT_AMBULATORY_CARE_PROVIDER_SITE_OTHER): Payer: BC Managed Care – PPO | Admitting: Neurology

## 2020-06-02 DIAGNOSIS — G44209 Tension-type headache, unspecified, not intractable: Secondary | ICD-10-CM

## 2020-06-02 DIAGNOSIS — G4733 Obstructive sleep apnea (adult) (pediatric): Secondary | ICD-10-CM

## 2020-06-02 DIAGNOSIS — G4720 Circadian rhythm sleep disorder, unspecified type: Secondary | ICD-10-CM

## 2020-06-02 DIAGNOSIS — G43009 Migraine without aura, not intractable, without status migrainosus: Secondary | ICD-10-CM

## 2020-06-02 DIAGNOSIS — G4719 Other hypersomnia: Secondary | ICD-10-CM

## 2020-06-02 DIAGNOSIS — G4486 Cervicogenic headache: Secondary | ICD-10-CM

## 2020-06-02 DIAGNOSIS — R0683 Snoring: Secondary | ICD-10-CM

## 2020-06-02 DIAGNOSIS — R519 Headache, unspecified: Secondary | ICD-10-CM

## 2020-06-04 DIAGNOSIS — R0600 Dyspnea, unspecified: Secondary | ICD-10-CM | POA: Diagnosis not present

## 2020-06-04 DIAGNOSIS — Z20828 Contact with and (suspected) exposure to other viral communicable diseases: Secondary | ICD-10-CM | POA: Diagnosis not present

## 2020-06-04 DIAGNOSIS — J209 Acute bronchitis, unspecified: Secondary | ICD-10-CM | POA: Diagnosis not present

## 2020-06-04 DIAGNOSIS — J0100 Acute maxillary sinusitis, unspecified: Secondary | ICD-10-CM | POA: Diagnosis not present

## 2020-06-04 DIAGNOSIS — R0981 Nasal congestion: Secondary | ICD-10-CM | POA: Diagnosis not present

## 2020-06-17 NOTE — Progress Notes (Signed)
Patient referred by Dr. Leonor Liv for recurrent HAs, seen by me on 05/14/20, diagnostic PSG on 06/02/20.   Please call and notify the patient that the recent sleep study showed moderate to severe obstructive sleep apnea. I recommend treatment for this in the form of CPAP. This will require a repeat sleep study for proper titration and mask fitting and correct monitoring of the oxygen saturations. Please explain to patient. I have placed an order in the chart. Thanks.  Huston Foley, MD, PhD [facility])

## 2020-06-17 NOTE — Addendum Note (Signed)
Addended by: Huston Foley on: 06/17/2020 06:24 PM   Modules accepted: Orders

## 2020-06-17 NOTE — Procedures (Signed)
PATIENT'S NAME:  George Grant, George Grant DOB:      Nov 09, 1980      MR#:    [id number]     DATE OF RECORDING: 06/02/2020 REFERRING M.D.:  Juleen China, MD Study Performed:   Baseline Polysomnogram HISTORY: 39 year old man with a history of reflux disease, allergic rhinitis, asthma, and overweight state, who reports recurrent headaches for the past several months. The patient endorsed the Epworth Sleepiness Scale at 11 points. The patient's weight 207 pounds with a height of 71 (inches), resulting in a BMI of 29. kg/m2. The patient's neck circumference measured 17.5 inches.  CURRENT MEDICATIONS: Tylenol, Allegra, Protonix, Imitrex   PROCEDURE:  This is a multichannel digital polysomnogram utilizing the Somnostar 11.2 system.  Electrodes and sensors were applied and monitored per AASM Specifications.   EEG, EOG, Chin and Limb EMG, were sampled at 200 Hz.  ECG, Snore and Nasal Pressure, Thermal Airflow, Respiratory Effort, CPAP Flow and Pressure, Oximetry was sampled at 50 Hz. Digital video and audio were recorded.      BASELINE STUDY  Lights Out was at 20:52 and Lights On at 04:59.  Total recording time (TRT) was 487.5 minutes, with a total sleep time (TST) of 351 minutes.   The patient's sleep latency was 59.5 minutes.  REM latency was 236.5 minutes, which is markedly delayed. The sleep efficiency was 72. %.     SLEEP ARCHITECTURE: WASO (Wake after sleep onset) was 118 minutes with moderate to severe sleep fragmentation noted. There were 138.5 minutes in Stage N1, 162 minutes Stage N2, 41 minutes Stage N3 and 9.5 minutes in Stage REM.  The percentage of Stage N1 was 39.5%, which is markedly increased, Stage N2 was 46.2%, Stage N3 was 11.7% and Stage R (REM sleep) was 2.7%, which is markedly reduced. The arousals were noted as: 418 were spontaneous, 0 were associated with PLMs, 108 were associated with respiratory events.  RESPIRATORY ANALYSIS:  There were a total of 125 respiratory events:  0 obstructive apneas,  0 central apneas and 2 mixed apneas with a total of 2 apneas and an apnea index (AI) of .3 /hour. There were 123 hypopneas with a hypopnea index of 21. /hour. The patient also had 0 respiratory event related arousals (RERAs).      The total APNEA/HYPOPNEA INDEX (AHI) was 21.4/hour and the total RESPIRATORY DISTURBANCE INDEX was  21.4 /hour.  7 events occurred in REM sleep and 234 events in NREM. The REM AHI was  44.2 /hour, versus a non-REM AHI of 20.7. The patient spent 193.5 minutes of total sleep time in the supine position and 158 minutes in non-supine.. The supine AHI was 38.7 versus a non-supine AHI of 0.0.  OXYGEN SATURATION & C02:  The Wake baseline 02 saturation was 98%, with the lowest being 82%. Time spent below 89% saturation equaled 7 minutes.  PERIODIC LIMB MOVEMENTS: The patient had a total of 0 Periodic Limb Movements.  The Periodic Limb Movement (PLM) index was 0 and the PLM Arousal index was 0/hour.  Audio and video analysis did not show any abnormal or unusual movements, behaviors, phonations or vocalizations.  The patient took no bathroom breaks. Moderate snoring was noted. The EKG was in keeping with normal sinus rhythm (NSR).  Post-study, the patient indicated that sleep was the same as usual.   IMPRESSION:  1. Obstructive Sleep Apnea (OSA) 2. Dysfunctions associated with sleep stages or arousal from sleep  RECOMMENDATIONS:  1. This study demonstrates moderate to severe obstructive sleep apnea, with a  total AHI of 21.4/hour, REM AHI of 44.2/hour, supine AHI of 38.7/hour and O2 nadir of 82%. Treatment with positive airway pressure in the form of CPAP is recommended. This will require a full night titration study to optimize therapy. Other treatment options may be limited secondary to the severity of his sleep disordered breathing but, generally speaking, may include avoidance of supine sleep position along with weight loss, upper airway or jaw surgery in selected patients or  the use of an oral appliance in certain patients. ENT evaluation and/or consultation with a maxillofacial surgeon or dentist may be feasible in some instances.    2. Please note that untreated obstructive sleep apnea may carry additional perioperative morbidity. Patients with significant obstructive sleep apnea should receive perioperative PAP therapy and the surgeons and particularly the anesthesiologist should be informed of the diagnosis and the severity of the sleep disordered breathing. 3. This study shows significant sleep fragmentation and abnormal sleep stage percentages; these are nonspecific findings and per se do not signify an intrinsic sleep disorder or a cause for the patient's sleep-related symptoms. Causes include (but are not limited to) the first night effect of the sleep study, circadian rhythm disturbances, medication effect or an underlying mood disorder or medical problem.  4. The patient should be cautioned not to drive, work at heights, or operate dangerous or heavy equipment when tired or sleepy. Review and reiteration of good sleep hygiene measures should be pursued with any patient. 5. The patient will be seen in follow-up in the sleep clinic at [facility] for discussion of the test results, symptom and treatment compliance review, further management strategies, etc. The referring provider will be notified of the test results.  I certify that I have reviewed the entire raw data recording prior to the issuance of this report in accordance with the Standards of Accreditation of the American Academy of Sleep Medicine (AASM)  Huston Foley, MD, PhD Diplomat, American Board of Neurology and Sleep Medicine (Neurology and Sleep Medicine)

## 2020-06-18 ENCOUNTER — Telehealth: Payer: Self-pay | Admitting: Neurology

## 2020-06-18 NOTE — Telephone Encounter (Signed)
I called pt. I advised pt that Dr. Frances Furbish reviewed their sleep study results and found that patient has moderate-severe sleep apnea and recommends that pt be treated with a cpap. Dr. Frances Furbish recommends that pt return for a repeat sleep study in order to properly titrate the cpap and ensure a good mask fit. Pt is agreeable to returning for a titration study. I advised pt that our sleep lab will file with pt's insurance and call pt to schedule the sleep study when we hear back from the pt's insurance regarding coverage of this sleep study. Pt verbalized understanding of results. Pt had no questions at this time but was encouraged to call back if questions arise.  **Pt wanted to bring to attention that his insurance will be changing. So he understands that we are filing through insurance which is currently [employer] but Jan 1 it will switch to [employer]. He obviously would prefer to have done before end of the year but patient knows that our schedule is booking up which is why wanted to inform of the change in case that will be a problem. I did tell him I would pass this information to the sleep lab so they know this information. He was appreciative.

## 2020-06-18 NOTE — Telephone Encounter (Signed)
-----   Message from Judi Cong, RN sent at 06/18/2020  8:13 AM EST -----  ----- Message ----- From: Huston Foley, MD Sent: 06/17/2020   6:24 PM EST To: Geronimo Running, RN  Patient referred by Dr. Leonor Liv for recurrent HAs, seen by me on 05/14/20, diagnostic PSG on 06/02/20.   Please call and notify the patient that the recent sleep study showed moderate to severe obstructive sleep apnea. I recommend treatment for this in the form of CPAP. This will require a repeat sleep study for proper titration and mask fitting and correct monitoring of the oxygen saturations. Please explain to patient. I have placed an order in the chart. Thanks.  Huston Foley, MD, PhD [facility])

## 2020-06-23 DIAGNOSIS — M5441 Lumbago with sciatica, right side: Secondary | ICD-10-CM | POA: Diagnosis not present

## 2020-06-23 DIAGNOSIS — M5136 Other intervertebral disc degeneration, lumbar region: Secondary | ICD-10-CM | POA: Diagnosis not present

## 2020-06-23 DIAGNOSIS — M62830 Muscle spasm of back: Secondary | ICD-10-CM | POA: Diagnosis not present

## 2020-06-23 DIAGNOSIS — M546 Pain in thoracic spine: Secondary | ICD-10-CM | POA: Diagnosis not present

## 2020-07-05 ENCOUNTER — Ambulatory Visit (INDEPENDENT_AMBULATORY_CARE_PROVIDER_SITE_OTHER): Payer: BC Managed Care – PPO | Admitting: Neurology

## 2020-07-05 DIAGNOSIS — R519 Headache, unspecified: Secondary | ICD-10-CM

## 2020-07-05 DIAGNOSIS — G44209 Tension-type headache, unspecified, not intractable: Secondary | ICD-10-CM

## 2020-07-05 DIAGNOSIS — G43009 Migraine without aura, not intractable, without status migrainosus: Secondary | ICD-10-CM

## 2020-07-05 DIAGNOSIS — G4486 Cervicogenic headache: Secondary | ICD-10-CM

## 2020-07-05 DIAGNOSIS — G4720 Circadian rhythm sleep disorder, unspecified type: Secondary | ICD-10-CM

## 2020-07-05 DIAGNOSIS — G4733 Obstructive sleep apnea (adult) (pediatric): Secondary | ICD-10-CM

## 2020-07-05 DIAGNOSIS — G4719 Other hypersomnia: Secondary | ICD-10-CM

## 2020-07-11 DIAGNOSIS — R051 Acute cough: Secondary | ICD-10-CM | POA: Diagnosis not present

## 2020-07-11 DIAGNOSIS — Z20828 Contact with and (suspected) exposure to other viral communicable diseases: Secondary | ICD-10-CM | POA: Diagnosis not present

## 2020-07-15 ENCOUNTER — Telehealth: Payer: Self-pay

## 2020-07-15 NOTE — Progress Notes (Signed)
Patient referred by Dr. Leonor Liv for recurrent HAs, seen by me on 05/14/20, diagnostic PSG on 06/02/20.  Patient had a CPAP titration study on 07/05/20.  Please call and inform patient that I have entered an order for treatment with positive airway pressure (PAP) treatment for obstructive sleep apnea (OSA). He did well during the latest sleep study with CPAP. We will, therefore, arrange for a machine for home use through a DME (durable medical equipment) company of His choice; and I will see the patient back in follow-up in about 10 weeks. Please also explain to the patient that I will be looking out for compliance data, which can be downloaded from the machine (stored on an SD card, that is inserted in the machine) or via remote access through a modem, that is built into the machine. At the time of the followup appointment we will discuss sleep study results and how it is going with PAP treatment at home. Please advise patient to bring His machine at the time of the first FU visit, even though this is cumbersome. Bringing the machine for every visit after that will likely not be needed, but often helps for the first visit to troubleshoot if needed. Please re-enforce the importance of compliance with treatment and the need for [place] to monitor compliance data - often an insurance requirement and actually good feedback for the patient as far as how they are doing.  Also remind patient, that any interim PAP machine or mask issues should be first addressed with the DME company, as they can often help better with technical and mask fit issues. Please ask if patient has a preference regarding DME company.  Please also make sure, the patient has a follow-up appointment with me in about 10 weeks from the setup date, thanks. May see one of our nurse practitioners if needed for proper timing of the FU appointment.  Please fax or rout report to the referring provider. Thanks,   Huston Foley, MD, PhD [facility])

## 2020-07-15 NOTE — Addendum Note (Signed)
Addended by: Huston Foley on: 07/15/2020 07:35 AM   Modules accepted: Orders

## 2020-07-15 NOTE — Telephone Encounter (Signed)
I called pt to discuss. No answer, left a message asking him to call me back. 

## 2020-07-15 NOTE — Procedures (Signed)
PATIENTS NAME:  George Grant, George Grant DOB:      1981/04/26      MR#:    [id number]     DATE OF RECORDING: 07/05/2020 REFERRING M.D.:  Juleen China, MD  Study Performed:   CPAP  Titration HISTORY: 39 year old man with a history of reflux disease, allergic rhinitis, asthma, and overweight state, who presents for a full night titration study. His baseline sleep study from 06/02/20 showed moderate to severe obstructive sleep apnea, with a total AHI of 21.4/hour, REM AHI of 44.2/hour, supine AHI of 38.7/hour and O2 nadir of 82%. The patient endorsed the Epworth Sleepiness Scale at 11 points. The patients weight 207 pounds with a height of 71 (inches), resulting in a BMI of 29. kg/m2. The patients neck circumference measured 17.5 inches.  CURRENT MEDICATIONS: Tylenol, Allegra, Protonix, Imitrex  PROCEDURE:  This is a multichannel digital polysomnogram utilizing the SomnoStar 11.2 system.  Electrodes and sensors were applied and monitored per AASM Specifications.   EEG, EOG, Chin and Limb EMG, were sampled at 200 Hz.  ECG, Snore and Nasal Pressure, Thermal Airflow, Respiratory Effort, CPAP Flow and Pressure, Oximetry was sampled at 50 Hz. Digital video and audio were recorded.      The patient was fitted with a medium and 30 fullface mask from [employer].  CPAP was initiated per AASM standards at a pressure of 5 cm and increase in 1 cm increments to a final pressure of 11 cm.  On the final pressure his AHI was 0/h, O2 nadir 93% with supine REM sleep achieved.  Lights Out was at 20:54 and Lights On at 04:59. Total recording time (TRT) was 486 minutes, with a total sleep time (TST) of 389 minutes. The patients sleep latency to persistent sleep was 124.5 minutes. REM latency was 118.5 minutes. The sleep efficiency was 80%.    SLEEP ARCHITECTURE: WASO (Wake after sleep onset) was 88.5 minutes with moderate sleep fragmentation noted. There were 117.5 minutes in Stage N1, 80 minutes Stage N2, 67 minutes Stage N3 and 124.5  minutes in Stage REM.  The percentage of Stage N1 was 30.2%, which is markedly increased, Stage N2 was 20.6%, which is reduced, Stage N3 was 17.2%, which is normal, and Stage R (REM sleep) was 32%, which is increased, and in keeping with rebound. The arousals were noted as: 320 were spontaneous, 0 were associated with PLMs, 12 were associated with respiratory events.  RESPIRATORY ANALYSIS:  There was a total of 16 respiratory events: 0 obstructive apneas, 0 central apneas and 0 mixed apneas with a total of 0 apneas and an apnea index (AI) of 0 /hour. There were 16 hypopneas with a hypopnea index of 2.5/hour. The patient also had 0 respiratory event related arousals (RERAs).      The total APNEA/HYPOPNEA INDEX  (AHI) was 2.5 /hour and the total RESPIRATORY DISTURBANCE INDEX was 2.5 /hour  0 events occurred in REM sleep and 16 events in NREM. The REM AHI was 0 /hour versus a non-REM AHI of 3.6 /hour.  The patient spent 217.5 minutes of total sleep time in the supine position and 172 minutes in non-supine. The supine AHI was 3.3, versus a non-supine AHI of 1.4.  OXYGEN SATURATION & C02:  The baseline 02 saturation was 96%, with the lowest being 88%. Time spent below 89% saturation equaled 0 minutes.  PERIODIC LIMB MOVEMENTS:  The patient had a total of 0 Periodic Limb Movements. The Periodic Limb Movement (PLM) index was 0 and the PLM Arousal index  was 0/hour.  Audio and video analysis did not show any abnormal or unusual movements, behaviors, phonations or vocalizations.  The patient took no bathroom breaks. The EKG was in keeping with normal sinus rhythm (NSR).   Post-study, the patient indicated that sleep was worse than usual.    IMPRESSION:   1. Obstructive Sleep Apnea (OSA) 2. Dysfunctions associated with sleep stages or arousal from sleep   RECOMMENDATIONS:   1. This study demonstrates resolution of the patient's obstructive sleep apnea with CPAP therapy. I will, therefore, start the patient  on home CPAP treatment at a pressure of 11 cm via medium full facemask with (heated) humidity. The patient will be advised to be fully compliant with PAP therapy to improve sleep related symptoms and decrease long term cardiovascular risks. The patient should be reminded, that it may take up to 3 months to get fully used to using PAP with all planned sleep. The earlier full compliance is achieved, the better long term compliance tends to be. Please note that untreated obstructive sleep apnea may carry additional perioperative morbidity. Patients with significant obstructive sleep apnea should receive perioperative PAP therapy and the surgeons and particularly the anesthesiologist should be informed of the diagnosis and the severity of the sleep disordered breathing. 2. This study significant sleep fragmentation and abnormal sleep stage percentages; these are nonspecific findings and per se do not signify an intrinsic sleep disorder or a cause for the patient's sleep-related symptoms. Causes include (but are not limited to) the first night effect of the sleep study, circadian rhythm disturbances, medication effect or an underlying mood disorder or medical problem.  3. The patient should be cautioned not to drive, work at heights, or operate dangerous or heavy equipment when tired or sleepy. Review and reiteration of good sleep hygiene measures should be pursued with any patient. 4. The patient will be seen in follow-up in the sleep clinic at [facility] for discussion of the test results, symptom and treatment compliance review, further management strategies, etc. The referring provider will be notified of the test results.   I certify that I have reviewed the entire raw data recording prior to the issuance of this report in accordance with the Standards of Accreditation of the American Academy of Sleep Medicine (AASM)   Huston Foley, MD, PhD Diplomat, American Board of Neurology and Sleep Medicine (Neurology and  Sleep Medicine)

## 2020-07-15 NOTE — Telephone Encounter (Signed)
-----   Message from George Foley, MD sent at 07/15/2020  7:35 AM EST ----- Patient referred by Dr. Leonor Liv for recurrent HAs, seen by me on 05/14/20, diagnostic PSG on 06/02/20.  Patient had a CPAP titration study on 07/05/20.  Please call and inform patient that I have entered an order for treatment with positive airway pressure (PAP) treatment for obstructive sleep apnea (OSA). He did well during the latest sleep study with CPAP. We will, therefore, arrange for a machine for home use through a DME (durable medical equipment) company of His choice; and I will see the patient back in follow-up in about 10 weeks. Please also explain to the patient that I will be looking out for compliance data, which can be downloaded from the machine (stored on an SD card, that is inserted in the machine) or via remote access through a modem, that is built into the machine. At the time of the followup appointment we will discuss sleep study results and how it is going with PAP treatment at home. Please advise patient to bring His machine at the time of the first FU visit, even though this is cumbersome. Bringing the machine for every visit after that will likely not be needed, but often helps for the first visit to troubleshoot if needed. Please re-enforce the importance of compliance with treatment and the need for [place] to monitor compliance data - often an insurance requirement and actually good feedback for the patient as far as how they are doing.  Also remind patient, that any interim PAP machine or mask issues should be first addressed with the DME company, as they can often help better with technical and mask fit issues. Please ask if patient has a preference regarding DME company.  Please also make sure, the patient has a follow-up appointment with me in about 10 weeks from the setup date, thanks. May see one of our nurse practitioners if needed for proper timing of the FU appointment.  Please fax or rout report to the  referring provider. Thanks,   George Foley, MD, PhD [facility])

## 2020-07-16 ENCOUNTER — Encounter: Payer: Self-pay | Admitting: Neurology

## 2020-07-16 NOTE — Telephone Encounter (Signed)
Pt called back to speak with RN

## 2020-07-20 NOTE — Telephone Encounter (Signed)
I called pt. I advised pt that Dr. Frances Furbish reviewed their sleep study results and found that pt did well during his latest sleep study with cpap. Dr. Frances Furbish recommends that pt start a cpap at home. I reviewed PAP compliance expectations with the pt. Pt is agreeable to starting a CPAP. I advised pt that an order will be sent to a DME, [facility], and [facility] will call the pt within about one week after they file with the pt's insurance. [facility] will show the pt how to use the machine, fit for masks, and troubleshoot the CPAP if needed. A follow up appt was made for insurance purposes with Dr. Frances Furbish on 10/26/20 at 8:30am. Pt is agreeable to cancelling his 08/17/20 appointment because his headaches are doing better. He will call if he needs a sooner appt for his HAs. Pt verbalized understanding to arrive 15 minutes early and bring their CPAP. A letter with all of this information in it will be mailed to the pt as a reminder. I verified with the pt that the address we have on file is correct. Pt verbalized understanding of results. Pt had no questions at this time but was encouraged to call back if questions arise. I have sent the order to [facility] and have received confirmation that they have received the order.

## 2020-07-22 DIAGNOSIS — M5441 Lumbago with sciatica, right side: Secondary | ICD-10-CM | POA: Diagnosis not present

## 2020-07-22 DIAGNOSIS — M5136 Other intervertebral disc degeneration, lumbar region: Secondary | ICD-10-CM | POA: Diagnosis not present

## 2020-07-22 DIAGNOSIS — M546 Pain in thoracic spine: Secondary | ICD-10-CM | POA: Diagnosis not present

## 2020-07-22 DIAGNOSIS — M62830 Muscle spasm of back: Secondary | ICD-10-CM | POA: Diagnosis not present

## 2020-08-03 ENCOUNTER — Encounter: Payer: Self-pay | Admitting: Neurology

## 2020-08-17 ENCOUNTER — Ambulatory Visit: Payer: BC Managed Care – PPO | Admitting: Neurology

## 2020-09-08 ENCOUNTER — Encounter: Payer: Self-pay | Admitting: Neurology

## 2020-09-08 ENCOUNTER — Other Ambulatory Visit: Payer: Self-pay | Admitting: Neurology

## 2020-10-26 ENCOUNTER — Ambulatory Visit: Payer: No Typology Code available for payment source | Admitting: Neurology

## 2020-10-26 ENCOUNTER — Encounter: Payer: Self-pay | Admitting: Neurology

## 2020-10-26 ENCOUNTER — Other Ambulatory Visit: Payer: Self-pay

## 2020-10-26 VITALS — BP 158/93 | HR 98 | Ht 71.0 in | Wt 229.0 lb

## 2020-10-26 DIAGNOSIS — Z9989 Dependence on other enabling machines and devices: Secondary | ICD-10-CM

## 2020-10-26 DIAGNOSIS — G43009 Migraine without aura, not intractable, without status migrainosus: Secondary | ICD-10-CM | POA: Diagnosis not present

## 2020-10-26 DIAGNOSIS — G4733 Obstructive sleep apnea (adult) (pediatric): Secondary | ICD-10-CM | POA: Diagnosis not present

## 2020-10-26 MED ORDER — AMITRIPTYLINE HCL 25 MG PO TABS: ORAL_TABLET | ORAL | 3 refills | Status: DC

## 2020-10-26 NOTE — Patient Instructions (Addendum)
It was good to see you again today.  I am glad to hear that your headaches are better.  You have adjusted well to your CPAP and hopefully will continue to do well on it and reap long-term benefit from it.  Please continue using your CPAP regularly. While your insurance requires that you use CPAP at least 4 hours each night on 70% of the nights, I recommend, that you not skip any nights and use it throughout the night if you can. Getting used to CPAP and staying with the treatment long term does take time and patience and discipline. Untreated obstructive sleep apnea when it is moderate to severe can have an adverse impact on cardiovascular health and raise her risk for heart disease, arrhythmias, hypertension, congestive heart failure, stroke and diabetes. Untreated obstructive sleep apnea causes sleep disruption, nonrestorative sleep, and sleep deprivation. This can have an impact on your day to day functioning and cause daytime sleepiness and impairment of cognitive function, memory loss, mood disturbance, and problems focussing. Using CPAP regularly can improve these symptoms.  For now, we will continue with the amitriptyline at the current dose, which is 2 pills at bedtime.  I have changed the prescription to a 90-day prescription with refills.  Please follow-up routinely to see one of our nurse practitioners in this clinic in 6 months, sooner if needed.  You can always call us or email [place] through MyChart for any interim questions or concerns.

## 2020-10-26 NOTE — Progress Notes (Signed)
Subjective:    Patient ID: Ahmaud Duthie is a 40 y.o. male.  HPI     Interim history:   Mr. Perrot is a 40 year old right-handed gentleman with an underlying medical history of reflux disease, allergic rhinitis, asthma, and overweight state, who Presents for follow-up consultation of his recurrent headaches.  The patient is unaccompanied today.  I first met him at the request of his primary care physician, at which time he reported a several month history of recurrent headaches.  He had tried Inderal for prevention but stopped it due to low heart rate.  He was encouraged to pursue an eye examination which was already pending.  He had a recent brain MRI.  He was advised to start on low-dose amitriptyline for prevention with gradual titration.  For as needed use, he was advised to continue with sumatriptan.  We talked about his sleep difficulties.  He was advised that he could have recurrent headaches secondary to underlying sleep disordered breathing.  He was advised to proceed with a sleep study.  He had a baseline sleep study, followed by a CPAP titration study.  Baseline sleep study from 06/02/2020 showed a sleep efficiency of 72%, sleep latency 59.5 minutes, REM latency delayed at 236.5 minutes.  He had an increased percentage of stage I sleep and a markedly reduced percentage of REM sleep.  Total AHI was 21.4/h, REM AHI 44.2/h, supine AHI 38.7/h.  Average oxygen saturation was 98%, nadir was 82%.  He had no significant PLM's.  He was advised to return for a titration study.  He had this on 07/05/2020.  Sleep efficiency was 80%, sleep latency to persistent sleep was delayed at 124.5 minutes, REM latency was 118.5 minutes.  He was fitted with a medium F 30 full facemask from [employer].  CPAP was titrated from 5 cm to a final pressure of 11 cm, at which point his AHI was 0/h, O2 nadir 93% with supine REM sleep achieved.  Based on his test results he was advised to start home CPAP therapy at a pressure of 11 cm.   His set up date was 08/13/2020.    Today, 10/26/20: I will review his CPAP compliance data for the last month later when we are tagged online.  He pulled off his usage data on his app on the cell phone.  He reports that he has not skipped any nights.  He can only view 2 weeks at a time and was consistent with his usage in the past 2 weeks, average usage of 6+ hours.  His sleep fluctuates.  Again, I can look at it better on a 30-day download through his machine or online.  If need be, he would be willing to bring his data card to [place] for download.  He reports that his headaches are better.  He has had only 2 or 3 headaches in the past 3 months and has not taken his Imitrex.  He has been on amitriptyline and tolerates it, currently 50 mg at bedtime.  He sleeps a little better as well.  His wife is certainly happier with the improvement in his snoring.  Sometimes the mask leaks.  He has a nasal mask and changes the insert every 2 weeks and it certainly makes a difference to keep a schedule with replacement as the first week the seal is much better.  He has benefited from his CPAP and that his sleep quality is better but he still wakes up in the middle of the night, he  is a restless sleeper.  He is also somewhat concerned about his allergies, allergy season is approaching and he may have some difficulty tolerating the nasal mask and the CPAP at the time.  He does use allergy medication and gets a shot once a year typically.  He has not noticed any side effects with amitriptyline except for mild dry mouth.  He is motivated to continue with his CPAP and also interested in continuing with the amitriptyline at this time.  He has an eye examination once a year.  The patient's allergies, current medications, family history, past medical history, past social history, past surgical history and problem list were reviewed and updated as appropriate.   Previously:   05/14/20: (He) reports recurrent headaches for the past  several months.  I reviewed your office note from 03/12/2020. You ordered brain imaging. He has been on sumatriptan as needed.  He presented to [facility] emergency department on 03/09/2020 and I reviewed the encounter records.  He was treated symptomatically with Toradol, and Zofran.  He has been on Inderal. He stopped the propranolol secondary to low heart rate.  He had a brain MRI with and without contrast through [place] on 03/20/2020 and I reviewed the results: Impression: No acute intracranial abnormality or mass.  Minimal cerebral white matter T2 signal changes, nonspecific though may reflect the sequelae of migraines, early chronic small vessel ischemia or prior infection/inflammation. He reports pain around the left eye often.  He also has neck pain which sometimes radiates up from the back.  He reports having experienced different types of headaches.  His migraines are typically on the left side, behind the left eye and above the left eye and associated with light sensitivity and nausea, typically no vomiting.  He typically does not have any visual aura but has had recurrent floaters.  He had a routine eye examination and is actually pending for his yearly eye exam later this month.  He wears contact lenses.  He also has experienced neck pain.  He has a longstanding history of low back pain and sees a chiropractor about once a month and noticed radiating pain from the neck upwards in the back of the head.  He has also noticed a more pressure like sensation across the forehead intermittently and at this juncture in the past few weeks he has had a nearly daily headache and is also woken up with a headache.  Of note, he does snore and the snoring has been bothersome to his wife from time to time.  He has an Epworth sleepiness score of 11 out of 24 today.  He does not have nightly nocturia.  He is not aware of any family history of migraines.  He has had occasional dizziness when standing.  He has not had  any recent falls.  He denies any sudden onset of one-sided weakness or numbness or tingling or droopy face or slurring of speech but has had tingling around his mouth in both hands occasionally with a headache.  He does not drink caffeine daily.  He drinks alcohol very occasionally, he is a non-smoker.  He works as an Education administrator.  He lives with his wife and 63-year-old daughter, they have 2 dogs in the household.  He goes to bed around 9 or 10 and has a rise time of 5 AM.  He has started going to bed a little earlier than typical for him secondary to feeling more tired during the day and not feeling fully  rested when he wakes up.  He has never had a sleep study but his wife has mentioned to him before that he should get checked out.   His Past Medical History Is Significant For: Past Medical History:  Diagnosis Date  . Anxiety   . Asthma   . Concussion without loss of consciousness   . GERD (gastroesophageal reflux disease)   . Hypertension   . Lower back pain   . Migraine   . Sinus problem     His Past Surgical History Is Significant For: Past Surgical History:  Procedure Laterality Date  . CHOLECYSTECTOMY      His Family History Is Significant For: Family History  Problem Relation Age of Onset  . Skin cancer Mother   . Heart attack Mother   . Skin cancer Father     His Social History Is Significant For: Social History   Socioeconomic History  . Marital status: Married    Spouse name: Not on file  . Number of children: Not on file  . Years of education: Not on file  . Highest education level: Not on file  Occupational History  . Not on file  Tobacco Use  . Smoking status: Never Smoker  . Smokeless tobacco: Never Used  Vaping Use  . Vaping Use: Never used  Substance and Sexual Activity  . Alcohol use: Yes    Alcohol/week: 1.0 standard drink    Types: 1 Cans of beer per week    Comment: socially  . Drug use: No  . Sexual activity: Yes    Partners:  Female  Other Topics Concern  . Not on file  Social History Narrative  . Not on file   Social Determinants of Health   Financial Resource Strain: Not on file  Food Insecurity: Not on file  Transportation Needs: Not on file  Physical Activity: Not on file  Stress: Not on file  Social Connections: Not on file    His Allergies Are:  Allergies  Allergen Reactions  . Amoxicillin Hives  . Doxycycline   . Penicillin G Hives  . Penicillins     amoxicillin  . Septra [Sulfamethoxazole-Trimethoprim] Hives  :   His Current Medications Are:  Outpatient Encounter Medications as of 10/26/2020  Medication Sig  . Acetaminophen (TYLENOL PO) Take by mouth. PRN for Headaches  . amitriptyline (ELAVIL) 25 MG tablet Take 2 tablets every night.  . famotidine (PEPCID) 40 MG tablet TAKE 1 TABLET BY MOUTH UP TO TWICE DAILY AS NEEDED FOR BREAKTHROUGH HEARTBURN  . fexofenadine (ALLEGRA) 180 MG tablet Take by mouth.  . Multiple Vitamin (MULTIVITAMIN PO) Take by mouth.  . pantoprazole (PROTONIX) 40 MG tablet Take 40 mg by mouth 2 (two) times daily.  . SUMAtriptan (IMITREX) 50 MG tablet Take 1 tablet (50 mg total) by mouth every 2 (two) hours as needed for migraine. May repeat in 2 hours if headache persists or recurs.   No facility-administered encounter medications on file as of 10/26/2020.  :  Review of Systems:  Out of a complete 14 point review of systems, all are reviewed and negative with the exception of these symptoms as listed below: Review of Systems  Neurological:       Patient presents today to follow-up on his headaches and CPAP.  Patient reports that his headaches are better.  He has not had to use Imitrex.  His CPAP is going well.    Objective:  Neurological Exam  Physical Exam Physical Examination:   Vitals:  10/26/20 0823  BP: (!) 158/93  Pulse: 98    General Examination: The patient is a very pleasant 40 y.o. male in no acute distress. He appears well-developed and  well-nourished and well groomed.   HEENT: Normocephalic, atraumatic, pupils are equal, round and reactive to light. Funduscopic exam is normal with sharp disc margins noted. Extraocular tracking is good without limitation to gaze excursion or nystagmus noted. Normal smooth pursuit is noted. Hearing is grossly intact. Face is symmetric with normal facial animation and normal facial sensation. Speech is clear with no dysarthria noted. There is no hypophonia. There is no lip, neck/head, jaw or voice tremor. Neck with FROM. There are no carotid bruits on auscultation. Oropharynx exam reveals: mild mouth dryness, adequate dental hygiene and moderate airway crowding.  Tongue protrudes centrally and palate elevates symmetrically.  Chest: Clear to auscultation without wheezing, rhonchi or crackles noted.  Heart: S1+S2+0, regular and normal without murmurs, rubs or gallops noted.   Abdomen: Soft, non-tender and non-distended.  Extremities: There is no pitting edema in the distal lower extremities bilaterally. Pedal pulses are intact.  Skin: Warm and dry without trophic changes noted. There are no varicose veins.  Musculoskeletal: exam reveals no obvious joint deformities, tenderness or joint swelling or erythema.   Neurologically:  Mental status: The patient is awake, alert and oriented in all 4 spheres. His immediate and remote memory, attention, language skills and fund of knowledge are appropriate. There is no evidence of aphasia, agnosia, apraxia or anomia. Speech is clear with normal prosody and enunciation. Thought process is linear. Mood is normal and affect is normal.  Cranial nerves II - XII are as described above under HEENT exam.  Motor exam: Normal bulk, strength and tone is noted. There is no drift, tremor or rebound. Romberg is negative. Reflexes are 2+ throughout. Fine motor skills and coordination: Grossly intact.  Cerebellar testing: No dysmetria or intention tremor on finger to  nose testing. Heel to shin is unremarkable bilaterally. There is no truncal or gait ataxia.  Sensory exam: intact to light touch in the upper and lower extremities.  Gait, station and balance: He stands easily. No veering to one side is noted. No leaning to one side is noted. Posture is age-appropriate and stance is narrow based. Gait shows normal stride length and normal pace. No problems turning are noted. Tandem walk is unremarkable.   Assessment and Plan:   In summary, Menashe Kafer is a very pleasant 40 year old male with an underlying medical history of reflux disease, allergic rhinitis, asthma, and overweight state, who presents for follow-up consultation of his several month history of headaches, after interim sleep testing.  History and examination were concerning for underlying sleep disordered breathing.  He has had left-sided migraine type headaches which have improved with preventative medication in the form of amitriptyline low-dose.  He is currently on 50 mg at bedtime and tolerates it.  He has not had to take his Imitrex in the past 3 months or so.  He has established treatment with CPAP therapy.  I will look at his compliance data when it is available and make an addendum to this note.  He endorses full compliance and good results, reports improvement in his snoring and also better sleep quality.  He is motivated to continue with his CPAP.  We talked about his sleep study results from his baseline sleep study in November 2021 and his titration study from December 2021.  He is commended for his treatment adherence.  We mutually agreed to continue amitriptyline at the current dose, I changed his prescription to 90 days with refills.  At the next appointment we can certainly consider reducing it or tapering off of it if he would favor.  For now, he would like to continue with it and has tolerated it well.  He is advised to follow-up in this clinic in 6 months routinely to see one of our nurse  practitioners.  I answered all his questions today and he was in agreement.   I spent 30 minutes in total face-to-face time and in reviewing records during pre-charting, more than 50% of which was spent in counseling and coordination of care, reviewing test results, reviewing medications and treatment regimen and/or in discussing or reviewing the diagnosis of Migraine and OSA, the prognosis and treatment options. Pertinent laboratory and imaging test results that were available during this visit with the patient were reviewed by me and considered in my medical decision making (see chart for details).

## 2021-01-18 ENCOUNTER — Other Ambulatory Visit: Payer: Self-pay

## 2021-01-18 MED ORDER — AMITRIPTYLINE HCL 25 MG PO TABS: ORAL_TABLET | ORAL | 3 refills | Status: DC

## 2021-04-27 NOTE — Progress Notes (Signed)
Chief Complaint  Patient presents with   Follow-up    Pt alone, rm 10. Pt denies headaches. Still using CPAP. DME [employer]. States he can tell when he doesn't get good sleep.      HISTORY OF PRESENT ILLNESS:  04/28/21 ALL:  George Grant is a 40 y.o. male here today for follow up for migraines and OSA on CPAP. He continues amitriptyline 90m at bedtime. He reports doing well, today. Headaches have resolved. He is using CPAP nightly. He has noted improved sleep quality and increased energy levels with using CPAP consistently. He has noted that his nasal cushion seems to be leaking after about 10-11 days. He is cleaning daily with Dawn.   Compliance report dated 03/19/2021-04/17/2021 shows that he used CPAP 30/30 days for greater than 4 hours. Average usage was 7hr and 285m. Residual AHI was 0.5/hr on set pressure of 11cmH20. No significant leak noted.    HISTORY (copied from Dr AtGuadelupe Sabinrevious note)  Mr. George Grant a [age]ear old right-handed gentleman with an underlying medical history of reflux disease, allergic rhinitis, asthma, and overweight state, who Presents for follow-up consultation of his recurrent headaches.  The patient is unaccompanied today.  I first met him at the request of his primary care physician, at which time he reported a several month history of recurrent headaches.  He had tried Inderal for prevention but stopped it due to low heart rate.  He was encouraged to pursue an eye examination which was already pending.  He had a recent brain MRI.  He was advised to start on low-dose amitriptyline for prevention with gradual titration.  For as needed use, he was advised to continue with sumatriptan.  We talked about his sleep difficulties.  He was advised that he could have recurrent headaches secondary to underlying sleep disordered breathing.  He was advised to proceed with a sleep study.  He had a baseline sleep study, followed by a CPAP titration study.  Baseline sleep study  from 06/02/2020 showed a sleep efficiency of 72%, sleep latency 59.5 minutes, REM latency delayed at 236.5 minutes.  He had an increased percentage of stage I sleep and a markedly reduced percentage of REM sleep.  Total AHI was 21.4/h, REM AHI 44.2/h, supine AHI 38.7/h.  Average oxygen saturation was 98%, nadir was 82%.  He had no significant PLM's.  He was advised to return for a titration study.  He had this on 07/05/2020.  Sleep efficiency was 80%, sleep latency to persistent sleep was delayed at 124.5 minutes, REM latency was 118.5 minutes.  He was fitted with a medium F 30 full facemask from [employer].  CPAP was titrated from 5 cm to a final pressure of 11 cm, at which point his AHI was 0/h, O2 nadir 93% with supine REM sleep achieved.  Based on his test results he was advised to start home CPAP therapy at a pressure of 11 cm.  His set up date was 08/13/2020.    Today, 10/26/20: I will review his CPAP compliance data for the last month later when we are tagged online.  He pulled off his usage data on his app on the cell phone.  He reports that he has not skipped any nights.  He can only view 2 weeks at a time and was consistent with his usage in the past 2 weeks, average usage of 6+ hours.  His sleep fluctuates.  Again, I can look at it better on a 30-day download through his  machine or online.  If need be, he would be willing to bring his data card to [place] for download.  He reports that his headaches are better.  He has had only 2 or 3 headaches in the past 3 months and has not taken his Imitrex.  He has been on amitriptyline and tolerates it, currently 50 mg at bedtime.  He sleeps a little better as well.  His wife is certainly happier with the improvement in his snoring.  Sometimes the mask leaks.  He has a nasal mask and changes the insert every 2 weeks and it certainly makes a difference to keep a schedule with replacement as the first week the seal is much better.  He has benefited from his CPAP and that his  sleep quality is better but he still wakes up in the middle of the night, he is a restless sleeper.  He is also somewhat concerned about his allergies, allergy season is approaching and he may have some difficulty tolerating the nasal mask and the CPAP at the time.  He does use allergy medication and gets a shot once a year typically.  He has not noticed any side effects with amitriptyline except for mild dry mouth.  He is motivated to continue with his CPAP and also interested in continuing with the amitriptyline at this time.  He has an eye examination once a year.   REVIEW OF SYSTEMS: Out of a complete 14 system review of symptoms, the patient complains only of the following symptoms, none and all other reviewed systems are negative.   ALLERGIES: Allergies  Allergen Reactions   Amoxicillin Hives   Doxycycline    Penicillin G Hives   Penicillins     amoxicillin   Septra [Sulfamethoxazole-Trimethoprim] Hives     HOME MEDICATIONS: Outpatient Medications Prior to Visit  Medication Sig Dispense Refill   Acetaminophen (TYLENOL PO) Take by mouth. PRN for Headaches     famotidine (PEPCID) 40 MG tablet TAKE 1 TABLET BY MOUTH UP TO TWICE DAILY AS NEEDED FOR BREAKTHROUGH HEARTBURN     fexofenadine (ALLEGRA) 180 MG tablet Take by mouth.     Multiple Vitamin (MULTIVITAMIN PO) Take by mouth.     pantoprazole (PROTONIX) 40 MG tablet Take 40 mg by mouth 2 (two) times daily.     SUMAtriptan (IMITREX) 50 MG tablet Take 1 tablet (50 mg total) by mouth every 2 (two) hours as needed for migraine. May repeat in 2 hours if headache persists or recurs. 10 tablet 0   amitriptyline (ELAVIL) 25 MG tablet Take 2 tablets every night. 180 tablet 3   No facility-administered medications prior to visit.     PAST MEDICAL HISTORY: Past Medical History:  Diagnosis Date   Anxiety    Asthma    Concussion without loss of consciousness    GERD (gastroesophageal reflux disease)    Hypertension    Lower back  pain    Migraine    Sinus problem      PAST SURGICAL HISTORY: Past Surgical History:  Procedure Laterality Date   CHOLECYSTECTOMY       FAMILY HISTORY: Family History  Problem Relation Age of Onset   Skin cancer Mother    Heart attack Mother    Skin cancer Father      SOCIAL HISTORY: Social History   Socioeconomic History   Marital status: Married    Spouse name: Not on file   Number of children: Not on file   Years of education: Not  on file   Highest education level: Not on file  Occupational History   Not on file  Tobacco Use   Smoking status: Never   Smokeless tobacco: Never  Vaping Use   Vaping Use: Never used  Substance and Sexual Activity   Alcohol use: Yes    Alcohol/week: 1.0 standard drink    Types: 1 Cans of beer per week    Comment: socially   Drug use: No   Sexual activity: Yes    Partners: Female  Other Topics Concern   Not on file  Social History Narrative   Not on file   Social Determinants of Health   Financial Resource Strain: Not on file  Food Insecurity: Not on file  Transportation Needs: Not on file  Physical Activity: Not on file  Stress: Not on file  Social Connections: Not on file  Intimate Partner Violence: Not on file     PHYSICAL EXAM  Vitals:   04/28/21 0901  BP: (!) 138/92  Pulse: 90  Weight: 225 lb (102.1 kg)  Height: 5' 11"  (1.803 m)   Body mass index is 31.38 kg/m.  Generalized: Well developed, in no acute distress  Cardiology: normal rate and rhythm, no murmur auscultated  Respiratory: clear to auscultation bilaterally    Neurological examination  Mentation: Alert oriented to time, place, history taking. Follows all commands speech and language fluent Cranial nerve II-XII: Pupils were equal round reactive to light. Extraocular movements were full, visual field were full on confrontational test. Facial sensation and strength were normal. Head turning and shoulder shrug  were normal and symmetric. Motor:  The motor testing reveals 5 over 5 strength of all 4 extremities. Good symmetric motor tone is noted throughout.  Gait and station: Gait is normal.    DIAGNOSTIC DATA (LABS, IMAGING, TESTING) - I reviewed patient records, labs, notes, testing and imaging myself where available.  No results found for: WBC, HGB, HCT, MCV, PLT    Component Value Date/Time   NA 138 02/06/2020 0819   K 5.0 02/06/2020 0819   CL 98 02/06/2020 0819   CO2 25 02/06/2020 0819   GLUCOSE 103 (H) 02/06/2020 0819   BUN 10 02/06/2020 0819   CREATININE 1.04 02/06/2020 0819   CALCIUM 9.6 02/06/2020 0819   PROT 7.3 02/06/2020 0819   ALBUMIN 4.6 02/06/2020 0819   AST 27 02/06/2020 0819   ALT 39 02/06/2020 0819   ALKPHOS 122 (H) 02/06/2020 0819   BILITOT 0.6 02/06/2020 0819   GFRNONAA 89 02/06/2020 0819   GFRAA 103 02/06/2020 0819   Lab Results  Component Value Date   CHOL 181 01/28/2020   HDL 36 (L) 01/28/2020   LDLCALC 122 (H) 01/28/2020   TRIG 128 01/28/2020   CHOLHDL 5.0 01/28/2020   Lab Results  Component Value Date   HGBA1C 5.3 01/28/2020   No results found for: VITAMINB12 No results found for: TSH  No flowsheet data found.   No flowsheet data found.   ASSESSMENT AND PLAN  40 y.o. year old male  has a past medical history of Anxiety, Asthma, Concussion without loss of consciousness, GERD (gastroesophageal reflux disease), Hypertension, Lower back pain, Migraine, and Sinus problem. here with    Migraine without aura and without status migrainosus, not intractable  OSA on CPAP  Deklan is doing very well, today. Headaches are well managed on amitriptline 38m at bedtime. We will continue treatment plan. Compliance report shows excellent compliance. He will continue nightly use for at least 4 hours.  He was advised to switch cleaning agent to [employer] or baby shampoo to see if this helps with longevity of nasal cushion. He may call DME if he continues to have concerns. He will follow up with me  in 1 year.    No orders of the defined types were placed in this encounter.    Meds ordered this encounter  Medications   amitriptyline (ELAVIL) 25 MG tablet    Sig: Take 2 tablets every night.    Dispense:  180 tablet    Refill:  3    Order Specific Question:   Supervising Provider    Answer:   Melvenia Beam [[id number]]       Debbora Presto, MSN, FNP-C 04/28/2021, 9:26 AM  [facility] [address] [place],  [address] [phone]

## 2021-04-28 ENCOUNTER — Ambulatory Visit: Payer: No Typology Code available for payment source | Admitting: Family Medicine

## 2021-04-28 ENCOUNTER — Encounter: Payer: Self-pay | Admitting: Family Medicine

## 2021-04-28 VITALS — BP 138/92 | HR 90 | Ht 71.0 in | Wt 225.0 lb

## 2021-04-28 DIAGNOSIS — G43009 Migraine without aura, not intractable, without status migrainosus: Secondary | ICD-10-CM

## 2021-04-28 DIAGNOSIS — Z9989 Dependence on other enabling machines and devices: Secondary | ICD-10-CM | POA: Diagnosis not present

## 2021-04-28 DIAGNOSIS — G4733 Obstructive sleep apnea (adult) (pediatric): Secondary | ICD-10-CM

## 2021-04-28 MED ORDER — AMITRIPTYLINE HCL 25 MG PO TABS: ORAL_TABLET | ORAL | 3 refills | Status: DC

## 2022-04-27 NOTE — Patient Instructions (Incomplete)
Below is our plan:  We will continue amitriptyline and sumatriptan. Consider extended release melatonin if needed for sleep. Please keep an eye on your BP. Call PCP is readings are elevated at home.   Please continue using your CPAP regularly. While your insurance requires that you use CPAP at least 4 hours each night on 70% of the nights, I recommend, that you not skip any nights and use it throughout the night if you can. Getting used to CPAP and staying with the treatment long term does take time and patience and discipline. Untreated obstructive sleep apnea when it is moderate to severe can have an adverse impact on cardiovascular health and raise her risk for heart disease, arrhythmias, hypertension, congestive heart failure, stroke and diabetes. Untreated obstructive sleep apnea causes sleep disruption, nonrestorative sleep, and sleep deprivation. This can have an impact on your day to day functioning and cause daytime sleepiness and impairment of cognitive function, memory loss, mood disturbance, and problems focussing. Using CPAP regularly can improve these symptoms.  Please make sure you are staying well hydrated. I recommend 50-60 ounces daily. Well balanced diet and regular exercise encouraged. Consistent sleep schedule with 6-8 hours recommended.   Please continue follow up with care team as directed.   Follow up with me in 1 year   You may receive a survey regarding today's visit. I encourage you to leave honest feed back as I do use this information to improve patient care. Thank you for seeing me today!

## 2022-04-27 NOTE — Progress Notes (Unsigned)
No chief complaint on file.    HISTORY OF PRESENT ILLNESS:  04/27/22 ALL:  George Grant returns for follow up for OSA on CPAP and migraines. He continues amitriptyline 57m at bedtime. And sumatriptan as needed. Headaches are   He continues CPAP nightly.   04/28/2021 ALL: George Grant a 41y.o. male here today for follow up for migraines and OSA on CPAP. He continues amitriptyline 522mat bedtime. He reports doing well, today. Headaches have resolved. He is using CPAP nightly. He has noted improved sleep quality and increased energy levels with using CPAP consistently. He has noted that his nasal cushion seems to be leaking after about 10-11 days. He is cleaning daily with Dawn.   Compliance report dated 03/19/2021-04/17/2021 shows that he used CPAP 30/30 days for greater than 4 hours. Average usage was 7hr and 2558m Residual AHI was 0.5/hr on set pressure of 11cmH20. No significant leak noted.    HISTORY (copied from Dr AthGuadelupe Sabinevious note)  Mr. George Grant a [age]ar old right-handed gentleman with an underlying medical history of reflux disease, allergic rhinitis, asthma, and overweight state, who Presents for follow-up consultation of his recurrent headaches.  The patient is unaccompanied today.  I first met him at the request of his primary care physician, at which time he reported a several month history of recurrent headaches.  He had tried Inderal for prevention but stopped it due to low heart rate.  He was encouraged to pursue an eye examination which was already pending.  He had a recent brain MRI.  He was advised to start on low-dose amitriptyline for prevention with gradual titration.  For as needed use, he was advised to continue with sumatriptan.  We talked about his sleep difficulties.  He was advised that he could have recurrent headaches secondary to underlying sleep disordered breathing.  He was advised to proceed with a sleep study.  He had a baseline sleep study, followed by a  CPAP titration study.  Baseline sleep study from 06/02/2020 showed a sleep efficiency of 72%, sleep latency 59.5 minutes, REM latency delayed at 236.5 minutes.  He had an increased percentage of stage I sleep and a markedly reduced percentage of REM sleep.  Total AHI was 21.4/h, REM AHI 44.2/h, supine AHI 38.7/h.  Average oxygen saturation was 98%, nadir was 82%.  He had no significant PLM's.  He was advised to return for a titration study.  He had this on 07/05/2020.  Sleep efficiency was 80%, sleep latency to persistent sleep was delayed at 124.5 minutes, REM latency was 118.5 minutes.  He was fitted with a medium F 30 full facemask from [employer].  CPAP was titrated from 5 cm to a final pressure of 11 cm, at which point his AHI was 0/h, O2 nadir 93% with supine REM sleep achieved.  Based on his test results he was advised to start home CPAP therapy at a pressure of 11 cm.  His set up date was 08/13/2020.    Today, 10/26/20: I will review his CPAP compliance data for the last month later when we are tagged online.  He pulled off his usage data on his app on the cell phone.  He reports that he has not skipped any nights.  He can only view 2 weeks at a time and was consistent with his usage in the past 2 weeks, average usage of 6+ hours.  His sleep fluctuates.  Again, I can look at it better on a 30-day download through  his machine or online.  If need be, he would be willing to bring his data card to [place] for download.  He reports that his headaches are better.  He has had only 2 or 3 headaches in the past 3 months and has not taken his Imitrex.  He has been on amitriptyline and tolerates it, currently 50 mg at bedtime.  He sleeps a little better as well.  His wife is certainly happier with the improvement in his snoring.  Sometimes the mask leaks.  He has a nasal mask and changes the insert every 2 weeks and it certainly makes a difference to keep a schedule with replacement as the first week the seal is much better.  He  has benefited from his CPAP and that his sleep quality is better but he still wakes up in the middle of the night, he is a restless sleeper.  He is also somewhat concerned about his allergies, allergy season is approaching and he may have some difficulty tolerating the nasal mask and the CPAP at the time.  He does use allergy medication and gets a shot once a year typically.  He has not noticed any side effects with amitriptyline except for mild dry mouth.  He is motivated to continue with his CPAP and also interested in continuing with the amitriptyline at this time.  He has an eye examination once a year.   REVIEW OF SYSTEMS: Out of a complete 14 system review of symptoms, the patient complains only of the following symptoms, none and all other reviewed systems are negative.   ALLERGIES: Allergies  Allergen Reactions   Amoxicillin Hives   Doxycycline    Penicillin G Hives   Penicillins     amoxicillin   Septra [Sulfamethoxazole-Trimethoprim] Hives     HOME MEDICATIONS: Outpatient Medications Prior to Visit  Medication Sig Dispense Refill   Acetaminophen (TYLENOL PO) Take by mouth. PRN for Headaches     amitriptyline (ELAVIL) 25 MG tablet Take 2 tablets every night. 180 tablet 3   famotidine (PEPCID) 40 MG tablet TAKE 1 TABLET BY MOUTH UP TO TWICE DAILY AS NEEDED FOR BREAKTHROUGH HEARTBURN     fexofenadine (ALLEGRA) 180 MG tablet Take by mouth.     Multiple Vitamin (MULTIVITAMIN PO) Take by mouth.     pantoprazole (PROTONIX) 40 MG tablet Take 40 mg by mouth 2 (two) times daily.     SUMAtriptan (IMITREX) 50 MG tablet Take 1 tablet (50 mg total) by mouth every 2 (two) hours as needed for migraine. May repeat in 2 hours if headache persists or recurs. 10 tablet 0   No facility-administered medications prior to visit.     PAST MEDICAL HISTORY: Past Medical History:  Diagnosis Date   Anxiety    Asthma    Concussion without loss of consciousness    GERD (gastroesophageal reflux  disease)    Hypertension    Lower back pain    Migraine    Sinus problem      PAST SURGICAL HISTORY: Past Surgical History:  Procedure Laterality Date   CHOLECYSTECTOMY       FAMILY HISTORY: Family History  Problem Relation Age of Onset   Skin cancer Mother    Heart attack Mother    Skin cancer Father      SOCIAL HISTORY: Social History   Socioeconomic History   Marital status: Married    Spouse name: Not on file   Number of children: Not on file   Years of education:  Not on file   Highest education level: Not on file  Occupational History   Not on file  Tobacco Use   Smoking status: Never   Smokeless tobacco: Never  Vaping Use   Vaping Use: Never used  Substance and Sexual Activity   Alcohol use: Yes    Alcohol/week: 1.0 standard drink of alcohol    Types: 1 Cans of beer per week    Comment: socially   Drug use: No   Sexual activity: Yes    Partners: Female  Other Topics Concern   Not on file  Social History Narrative   Not on file   Social Determinants of Health   Financial Resource Strain: Not on file  Food Insecurity: Not on file  Transportation Needs: Not on file  Physical Activity: Not on file  Stress: Not on file  Social Connections: Not on file  Intimate Partner Violence: Not on file     PHYSICAL EXAM  There were no vitals filed for this visit.  There is no height or weight on file to calculate BMI.  Generalized: Well developed, in no acute distress  Cardiology: normal rate and rhythm, no murmur auscultated  Respiratory: clear to auscultation bilaterally    Neurological examination  Mentation: Alert oriented to time, place, history taking. Follows all commands speech and language fluent Cranial nerve II-XII: Pupils were equal round reactive to light. Extraocular movements were full, visual field were full on confrontational test. Facial sensation and strength were normal. Head turning and shoulder shrug  were normal and  symmetric. Motor: The motor testing reveals 5 over 5 strength of all 4 extremities. Good symmetric motor tone is noted throughout.  Gait and station: Gait is normal.    DIAGNOSTIC DATA (LABS, IMAGING, TESTING) - I reviewed patient records, labs, notes, testing and imaging myself where available.  No results found for: "WBC", "HGB", "HCT", "MCV", "PLT"    Component Value Date/Time   NA 138 02/06/2020 0819   K 5.0 02/06/2020 0819   CL 98 02/06/2020 0819   CO2 25 02/06/2020 0819   GLUCOSE 103 (H) 02/06/2020 0819   BUN 10 02/06/2020 0819   CREATININE 1.04 02/06/2020 0819   CALCIUM 9.6 02/06/2020 0819   PROT 7.3 02/06/2020 0819   ALBUMIN 4.6 02/06/2020 0819   AST 27 02/06/2020 0819   ALT 39 02/06/2020 0819   ALKPHOS 122 (H) 02/06/2020 0819   BILITOT 0.6 02/06/2020 0819   GFRNONAA 89 02/06/2020 0819   GFRAA 103 02/06/2020 0819   Lab Results  Component Value Date   CHOL 181 01/28/2020   HDL 36 (L) 01/28/2020   LDLCALC 122 (H) 01/28/2020   TRIG 128 01/28/2020   CHOLHDL 5.0 01/28/2020   Lab Results  Component Value Date   HGBA1C 5.3 01/28/2020   No results found for: "VITAMINB12" No results found for: "TSH"      No data to display               No data to display           ASSESSMENT AND PLAN  41 y.o. year old male  has a past medical history of Anxiety, Asthma, Concussion without loss of consciousness, GERD (gastroesophageal reflux disease), Hypertension, Lower back pain, Migraine, and Sinus problem. here with    No diagnosis found.  Ramesses is doing very well, today. Headaches are well managed on amitriptline 31m at bedtime. We will continue treatment plan. Compliance report shows excellent compliance. He will continue nightly use  for at least 4 hours. He was advised to switch cleaning agent to [employer] or baby shampoo to see if this helps with longevity of nasal cushion. He may call DME if he continues to have concerns. He will follow up with me in 1 year.     No orders of the defined types were placed in this encounter.     No orders of the defined types were placed in this encounter.      Debbora Presto, MSN, FNP-C 04/27/2022, 9:08 AM  [facility] [address], [place], [place] [address] [phone]

## 2022-04-28 ENCOUNTER — Encounter: Payer: Self-pay | Admitting: Family Medicine

## 2022-04-28 ENCOUNTER — Ambulatory Visit: Payer: No Typology Code available for payment source | Admitting: Family Medicine

## 2022-04-28 VITALS — BP 152/96 | HR 113 | Ht 71.0 in | Wt 223.8 lb

## 2022-04-28 DIAGNOSIS — G43009 Migraine without aura, not intractable, without status migrainosus: Secondary | ICD-10-CM

## 2022-04-28 DIAGNOSIS — G4733 Obstructive sleep apnea (adult) (pediatric): Secondary | ICD-10-CM | POA: Diagnosis not present

## 2022-04-28 DIAGNOSIS — G43709 Chronic migraine without aura, not intractable, without status migrainosus: Secondary | ICD-10-CM

## 2022-04-28 DIAGNOSIS — Z9989 Dependence on other enabling machines and devices: Secondary | ICD-10-CM

## 2022-04-28 MED ORDER — AMITRIPTYLINE HCL 25 MG PO TABS: ORAL_TABLET | ORAL | 3 refills | Status: DC

## 2022-04-28 MED ORDER — SUMATRIPTAN SUCCINATE 50 MG PO TABS: 50.0000 mg | ORAL_TABLET | ORAL | 0 refills | Status: DC | PRN

## 2022-04-29 ENCOUNTER — Telehealth: Payer: Self-pay

## 2023-04-19 ENCOUNTER — Ambulatory Visit: Payer: No Typology Code available for payment source | Admitting: Family Medicine

## 2023-04-26 NOTE — Patient Instructions (Addendum)
Please continue using your CPAP regularly. While your insurance requires that you use CPAP at least 4 hours each night on 70% of the nights, I recommend, that you not skip any nights and use it throughout the night if you can. Getting used to CPAP and staying with the treatment long term does take time and patience and discipline. Untreated obstructive sleep apnea when it is moderate to severe can have an adverse impact on cardiovascular health and raise her risk for heart disease, arrhythmias, hypertension, congestive heart failure, stroke and diabetes. Untreated obstructive sleep apnea causes sleep disruption, nonrestorative sleep, and sleep deprivation. This can have an impact on your day to day functioning and cause daytime sleepiness and impairment of cognitive function, memory loss, mood disturbance, and problems focussing. Using CPAP regularly can improve these symptoms.  We will update supply orders, today. We will continue amitriptyline 50mg  at bedtime. If you want to wean dose, you can reduce dose by 1/2 tablet every month.   Follow up in 1 year

## 2023-04-26 NOTE — Progress Notes (Signed)
Chief Complaint  Patient presents with   Follow-up    Pt in room 2. Here for migraine/cpap follow up. Pt reports no migraines, small headaches. Pt reports doing well with cpap machine.      HISTORY OF PRESENT ILLNESS:  05/02/23 ALL:  George Grant returns for follow up for migraines and OSA on CPAP. He continues amitriptyline 50mg  at bedtime. Headaches are very well managed. He may have a mild headache 1-2 times a month. He has not used sumatriptan. He does not feel he needs an abortive agent at this time. He continues to do well on CPAP therapy. He is using his machine nightly for about 7 hours, on average. Current report only showing usage for 30/38 days but he has not scanned newest data. He does have some grogginess during the day. Unclear if related to medication side effects.     04/28/2022 ALL:  George Grant returns for follow up for OSA on CPAP and migraines. He continues amitriptyline 50mg  at bedtime. And sumatriptan as needed. Headaches are well managed. He has had a couple of headaches since last being seen but these have been mild. He has not used sumatriptan recently. He does not usually check BP at home. He does not pulse is elevated at times when walking at work. No chest pain or shob.   He continues CPAP nightly. He is doing well on therapy. He does wake a couple of times throughout the night but able to go right back to sleep. Unsure what wakes him.     04/28/2021 ALL: George Grant is a 42 y.o. male here today for follow up for migraines and OSA on CPAP. He continues amitriptyline 50mg  at bedtime. He reports doing well, today. Headaches have resolved. He is using CPAP nightly. He has noted improved sleep quality and increased energy levels with using CPAP consistently. He has noted that his nasal cushion seems to be leaking after about 10-11 days. He is cleaning daily with Dawn.   Compliance report dated 03/19/2021-04/17/2021 shows that he used CPAP 30/30 days for greater than 4 hours.  Average usage was 7hr and . Residual AHI was 0.5/hr on set pressure of 11cmH20. No significant leak noted.    HISTORY (copied from Dr Teofilo Pod previous note)  George Grant is a 42 year old right-handed gentleman with an underlying medical history of reflux disease, allergic rhinitis, asthma, and overweight state, who Presents for follow-up consultation of his recurrent headaches.  The patient is unaccompanied today.  I first met him at the request of his primary care physician, at which time he reported a several month history of recurrent headaches.  He had tried Inderal for prevention but stopped it due to low heart rate.  He was encouraged to pursue an eye examination which was already pending.  He had a recent brain MRI.  He was advised to start on low-dose amitriptyline for prevention with gradual titration.  For as needed use, he was advised to continue with sumatriptan.  We talked about his sleep difficulties.  He was advised that he could have recurrent headaches secondary to underlying sleep disordered breathing.  He was advised to proceed with a sleep study.  He had a baseline sleep study, followed by a CPAP titration study.  Baseline sleep study from 06/02/2020 showed a sleep efficiency of 72%, sleep latency 59.5 minutes, REM latency delayed at 236.5 minutes.  He had an increased percentage of stage I sleep and a markedly reduced percentage of REM sleep.  Total AHI was  21.4/h, REM AHI 44.2/h, supine AHI 38.7/h.  Average oxygen saturation was 98%, nadir was 82%.  He had no significant PLM's.  He was advised to return for a titration study.  He had this on 07/05/2020.  Sleep efficiency was 80%, sleep latency to persistent sleep was delayed at 124.5 minutes, REM latency was 118.5 minutes.  He was fitted with a medium F 30 full facemask from [employer].  CPAP was titrated from 5 cm to a final pressure of 11 cm, at which point his AHI was 0/h, O2 nadir 93% with supine REM sleep achieved.  Based on his test  results he was advised to start home CPAP therapy at a pressure of 11 cm.  His set up date was 08/13/2020.    Today, 10/26/20: I will review his CPAP compliance data for the last month later when we are tagged online.  He pulled off his usage data on his app on the cell phone.  He reports that he has not skipped any nights.  He can only view 2 weeks at a time and was consistent with his usage in the past 2 weeks, average usage of 6+ hours.  His sleep fluctuates.  Again, I can look at it better on a 30-day download through his machine or online.  If need be, he would be willing to bring his data card to [place] for download.  He reports that his headaches are better.  He has had only 2 or 3 headaches in the past 3 months and has not taken his Imitrex.  He has been on amitriptyline and tolerates it, currently 50 mg at bedtime.  He sleeps a little better as well.  His wife is certainly happier with the improvement in his snoring.  Sometimes the mask leaks.  He has a nasal mask and changes the insert every 2 weeks and it certainly makes a difference to keep a schedule with replacement as the first week the seal is much better.  He has benefited from his CPAP and that his sleep quality is better but he still wakes up in the middle of the night, he is a restless sleeper.  He is also somewhat concerned about his allergies, allergy season is approaching and he may have some difficulty tolerating the nasal mask and the CPAP at the time.  He does use allergy medication and gets a shot once a year typically.  He has not noticed any side effects with amitriptyline except for mild dry mouth.  He is motivated to continue with his CPAP and also interested in continuing with the amitriptyline at this time.  He has an eye examination once a year.   REVIEW OF SYSTEMS: Out of a complete 14 system review of symptoms, the patient complains only of the following symptoms, none and all other reviewed systems are negative.  ESS: 11/24,  previously 7/24   ALLERGIES: Allergies  Allergen Reactions   Amoxicillin Hives   Doxycycline    Penicillin G Hives   Penicillins     amoxicillin   Septra [Sulfamethoxazole-Trimethoprim] Hives     HOME MEDICATIONS: Outpatient Medications Prior to Visit  Medication Sig Dispense Refill   Acetaminophen (TYLENOL PO) Take by mouth. PRN for Headaches     famotidine (PEPCID) 40 MG tablet TAKE 1 TABLET BY MOUTH UP TO TWICE DAILY AS NEEDED FOR BREAKTHROUGH HEARTBURN     fexofenadine (ALLEGRA) 180 MG tablet Take by mouth.     losartan-hydrochlorothiazide (HYZAAR) 50-12.5 MG tablet Take 1 tablet by  mouth daily.     Multiple Vitamins-Minerals (MENS MULTIVITAMIN PO) Take 1 tablet by mouth daily.     pantoprazole (PROTONIX) 40 MG tablet Take 40 mg by mouth 2 (two) times daily.     SUMAtriptan (IMITREX) 50 MG tablet Take 1 tablet (50 mg total) by mouth every 2 (two) hours as needed for migraine. May repeat in 2 hours if headache persists or recurs. 10 tablet 0   Multiple Vitamin (MULTIVITAMIN PO) Take by mouth.     amitriptyline (ELAVIL) 25 MG tablet Take 2 tablets every night. 180 tablet 3   No facility-administered medications prior to visit.     PAST MEDICAL HISTORY: Past Medical History:  Diagnosis Date   Anxiety    Asthma    Concussion without loss of consciousness    GERD (gastroesophageal reflux disease)    Hypertension    Lower back pain    Migraine    Sinus problem      PAST SURGICAL HISTORY: Past Surgical History:  Procedure Laterality Date   CHOLECYSTECTOMY       FAMILY HISTORY: Family History  Problem Relation Age of Onset   Skin cancer Mother    Heart attack Mother    Skin cancer Father      SOCIAL HISTORY: Social History   Socioeconomic History   Marital status: Married    Spouse name: Not on file   Number of children: Not on file   Years of education: Not on file   Highest education level: Not on file  Occupational History   Not on file  Tobacco  Use   Smoking status: Never   Smokeless tobacco: Never  Vaping Use   Vaping status: Never Used  Substance and Sexual Activity   Alcohol use: Yes    Alcohol/week: 1.0 standard drink of alcohol    Types: 1 Cans of beer per week    Comment: socially   Drug use: No   Sexual activity: Yes    Partners: Female  Other Topics Concern   Not on file  Social History Narrative   Not on file   Social Determinants of Health   Financial Resource Strain: Not on file  Food Insecurity: Not on file  Transportation Needs: Not on file  Physical Activity: Not on file  Stress: Not on file  Social Connections: Not on file  Intimate Partner Violence: Not on file     PHYSICAL EXAM  Vitals:   05/02/23 0846  BP: 128/78  Pulse: 89  Weight: 227 lb 8 oz (103.2 kg)  Height: 5\' 11"  (1.803 m)     Body mass index is 31.73 kg/m.  Generalized: Well developed, in no acute distress Neurological examination  Mentation: Alert oriented to time, place, history taking. Follows all commands speech and language fluent Cranial nerve II-XII: Pupils were equal round reactive to light. Extraocular movements were full, visual field were full on confrontational test. Facial sensation and strength were normal. Head turning and shoulder shrug  were normal and symmetric. Motor: The motor testing reveals 5 over 5 strength of all 4 extremities. Good symmetric motor tone is noted throughout.  Gait and station: Gait is normal.    DIAGNOSTIC DATA (LABS, IMAGING, TESTING) - I reviewed patient records, labs, notes, testing and imaging myself where available.  No results found for: "WBC", "HGB", "HCT", "MCV", "PLT"    Component Value Date/Time   NA 138 02/06/2020 0819   K 5.0 02/06/2020 0819   CL 98 02/06/2020 0819   CO2  25 02/06/2020 0819   GLUCOSE 103 (H) 02/06/2020 0819   BUN 10 02/06/2020 0819   CREATININE 1.04 02/06/2020 0819   CALCIUM 9.6 02/06/2020 0819   PROT 7.3 02/06/2020 0819   ALBUMIN 4.6 02/06/2020  0819   AST 27 02/06/2020 0819   ALT 39 02/06/2020 0819   ALKPHOS 122 (H) 02/06/2020 0819   BILITOT 0.6 02/06/2020 0819   GFRNONAA 89 02/06/2020 0819   GFRAA 103 02/06/2020 0819   Lab Results  Component Value Date   CHOL 181 01/28/2020   HDL 36 (L) 01/28/2020   LDLCALC 122 (H) 01/28/2020   TRIG 128 01/28/2020   CHOLHDL 5.0 01/28/2020   Lab Results  Component Value Date   HGBA1C 5.3 01/28/2020   No results found for: "VITAMINB12" No results found for: "TSH"      No data to display               No data to display           ASSESSMENT AND PLAN  42 y.o. year old male  has a past medical history of Anxiety, Asthma, Concussion without loss of consciousness, GERD (gastroesophageal reflux disease), Hypertension, Lower back pain, Migraine, and Sinus problem. here with    OSA on CPAP - Plan: For home use only DME continuous positive airway pressure (CPAP)  Chronic migraine without aura without status migrainosus, not intractable  Tung is doing very well, today. Headaches are well managed on amitriptline 50mg  at bedtime. We will continue treatment plan. He may consider weaning dose by 1/2 tablet every 4 weeks to see if this helps with daytime grogginess. Compliance report shows excellent compliance. He will continue nightly use for at least 4 hours. Healthy lifestyle habits encouraged. He will follow up with me in 1 year.    Orders Placed This Encounter  Procedures   For home use only DME continuous positive airway pressure (CPAP)    Supplies    Order Specific Question:   Length of Need    Answer:   Lifetime    Order Specific Question:   Patient has OSA or probable OSA    Answer:   Yes    Order Specific Question:   Is the patient currently using CPAP in the home    Answer:   Yes    Order Specific Question:   Settings    Answer:   Other see comments    Order Specific Question:   CPAP supplies needed    Answer:   Mask, headgear, cushions, filters, heated tubing  and water chamber      Meds ordered this encounter  Medications   amitriptyline (ELAVIL) 25 MG tablet    Sig: Take 2 tablets every night.    Dispense:  180 tablet    Refill:  3    Order Specific Question:   Supervising Provider    Answer:   Anson Fret [[id number]]       Shawnie Dapper, MSN, FNP-C 05/02/2023, 9:12 AM  [facility] [address], [address] [place], [place] [address] [phone]

## 2023-05-02 ENCOUNTER — Ambulatory Visit: Payer: No Typology Code available for payment source | Admitting: Family Medicine

## 2023-05-02 ENCOUNTER — Encounter: Payer: Self-pay | Admitting: Family Medicine

## 2023-05-02 VITALS — BP 128/78 | HR 89 | Ht 71.0 in | Wt 227.5 lb

## 2023-05-02 DIAGNOSIS — G43709 Chronic migraine without aura, not intractable, without status migrainosus: Secondary | ICD-10-CM

## 2023-05-02 DIAGNOSIS — G4733 Obstructive sleep apnea (adult) (pediatric): Secondary | ICD-10-CM | POA: Diagnosis not present

## 2023-05-02 MED ORDER — AMITRIPTYLINE HCL 25 MG PO TABS: ORAL_TABLET | ORAL | 3 refills | Status: DC

## 2023-05-26 ENCOUNTER — Encounter: Payer: Self-pay | Admitting: Family Medicine

## 2024-05-13 NOTE — Patient Instructions (Signed)
 Please continue using your CPAP regularly. While your insurance requires that you use CPAP at least 4 hours each night on 70% of the nights, I recommend, that you not skip any nights and use it throughout the night if you can. Getting used to CPAP and staying with the treatment long term does take time and patience and discipline. Untreated obstructive sleep apnea when it is moderate to severe can have an adverse impact on cardiovascular health and raise her risk for heart disease, arrhythmias, hypertension, congestive heart failure, stroke and diabetes. Untreated obstructive sleep apnea causes sleep disruption, nonrestorative sleep, and sleep deprivation. This can have an impact on your day to day functioning and cause daytime sleepiness and impairment of cognitive function, memory loss, mood disturbance, and problems focussing. Using CPAP regularly can improve these symptoms.  We will update supply orders, today. Continue amitriptyline  50mg  daily at bedtime.   Follow up in 1 year

## 2024-05-13 NOTE — Progress Notes (Unsigned)
 No chief complaint on file.    HISTORY OF PRESENT ILLNESS:  05/13/24 ALL:  George Grant returns for follow up for OSA on CPAP and migraines. He continues to do well on CPAP therapy and amitriptyline  50mg  at bedtime.     05/02/2023 ALL:  Bronco returns for follow up for migraines and OSA on CPAP. He continues amitriptyline  50mg  at bedtime. Headaches are very well managed. He may have a mild headache 1-2 times a month. He has not used sumatriptan . He does not feel he needs an abortive agent at this time. He continues to do well on CPAP therapy. He is using his machine nightly for about 7 hours, on average. Current report only showing usage for 30/38 days but he has not scanned newest data. He does have some grogginess during the day. Unclear if related to medication side effects.     04/28/2022 ALL:  George Grant returns for follow up for OSA on CPAP and migraines. He continues amitriptyline  50mg  at bedtime. And sumatriptan  as needed. Headaches are well managed. He has had a couple of headaches since last being seen but these have been mild. He has not used sumatriptan  recently. He does not usually check BP at home. He does not pulse is elevated at times when walking at work. No chest pain or shob.   He continues CPAP nightly. He is doing well on therapy. He does wake a couple of times throughout the night but able to go right back to sleep. Unsure what wakes him.     04/28/2021 ALL: George Grant is a 43 y.o. male here today for follow up for migraines and OSA on CPAP. He continues amitriptyline  50mg  at bedtime. He reports doing well, today. Headaches have resolved. He is using CPAP nightly. He has noted improved sleep quality and increased energy levels with using CPAP consistently. He has noted that his nasal cushion seems to be leaking after about 10-11 days. He is cleaning daily with Dawn.   Compliance report dated 03/19/2021-04/17/2021 shows that he used CPAP 30/30 days for greater than 4 hours.  Average usage was 7hr and . Residual AHI was 0.5/hr on set pressure of 11cmH20. No significant leak noted.    HISTORY (copied from Dr Obie previous note)  George Grant is a 43 year old right-handed gentleman with an underlying medical history of reflux disease, allergic rhinitis, asthma, and overweight state, who Presents for follow-up consultation of his recurrent headaches.  The patient is unaccompanied today.  I first met him at the request of his primary care physician, at which time he reported a several month history of recurrent headaches.  He had tried Inderal  for prevention but stopped it due to low heart rate.  He was encouraged to pursue an eye examination which was already pending.  He had a recent brain MRI.  He was advised to start on low-dose amitriptyline  for prevention with gradual titration.  For as needed use, he was advised to continue with sumatriptan .  We talked about his sleep difficulties.  He was advised that he could have recurrent headaches secondary to underlying sleep disordered breathing.  He was advised to proceed with a sleep study.  He had a baseline sleep study, followed by a CPAP titration study.  Baseline sleep study from 06/02/2020 showed a sleep efficiency of 72%, sleep latency 59.5 minutes, REM latency delayed at 236.5 minutes.  He had an increased percentage of stage I sleep and a markedly reduced percentage of REM sleep.  Total AHI was  21.4/h, REM AHI 44.2/h, supine AHI 38.7/h.  Average oxygen saturation was 98%, nadir was 82%.  He had no significant PLM's.  He was advised to return for a titration study.  He had this on 07/05/2020.  Sleep efficiency was 80%, sleep latency to persistent sleep was delayed at 124.5 minutes, REM latency was 118.5 minutes.  He was fitted with a medium F 30 full facemask from [employer].  CPAP was titrated from 5 cm to a final pressure of 11 cm, at which point his AHI was 0/h, O2 nadir 93% with supine REM sleep achieved.  Based on his test  results he was advised to start home CPAP therapy at a pressure of 11 cm.  His set up date was 08/13/2020.    Today, 10/26/20: I will review his CPAP compliance data for the last month later when we are tagged online.  He pulled off his usage data on his app on the cell phone.  He reports that he has not skipped any nights.  He can only view 2 weeks at a time and was consistent with his usage in the past 2 weeks, average usage of 6+ hours.  His sleep fluctuates.  Again, I can look at it better on a 30-day download through his machine or online.  If need be, he would be willing to bring his data card to us  for download.  He reports that his headaches are better.  He has had only 2 or 3 headaches in the past 3 months and has not taken his Imitrex .  He has been on amitriptyline  and tolerates it, currently 50 mg at bedtime.  He sleeps a little better as well.  His wife is certainly happier with the improvement in his snoring.  Sometimes the mask leaks.  He has a nasal mask and changes the insert every 2 weeks and it certainly makes a difference to keep a schedule with replacement as the first week the seal is much better.  He has benefited from his CPAP and that his sleep quality is better but he still wakes up in the middle of the night, he is a restless sleeper.  He is also somewhat concerned about his allergies, allergy season is approaching and he may have some difficulty tolerating the nasal mask and the CPAP at the time.  He does use allergy medication and gets a shot once a year typically.  He has not noticed any side effects with amitriptyline  except for mild dry mouth.  He is motivated to continue with his CPAP and also interested in continuing with the amitriptyline  at this time.  He has an eye examination once a year.   REVIEW OF SYSTEMS: Out of a complete 14 system review of symptoms, the patient complains only of the following symptoms, none and all other reviewed systems are negative.  ESS: 11/24,  previously 7/24   ALLERGIES: Allergies  Allergen Reactions   Amoxicillin Hives   Doxycycline    Penicillin G Hives   Penicillins     amoxicillin   Septra [Sulfamethoxazole-Trimethoprim] Hives     HOME MEDICATIONS: Outpatient Medications Prior to Visit  Medication Sig Dispense Refill   Acetaminophen (TYLENOL PO) Take by mouth. PRN for Headaches     amitriptyline  (ELAVIL ) 25 MG tablet Take 2 tablets every night. 180 tablet 3   famotidine (PEPCID) 40 MG tablet TAKE 1 TABLET BY MOUTH UP TO TWICE DAILY AS NEEDED FOR BREAKTHROUGH HEARTBURN     fexofenadine (ALLEGRA) 180 MG tablet Take  by mouth.     losartan-hydrochlorothiazide (HYZAAR) 50-12.5 MG tablet Take 1 tablet by mouth daily.     Multiple Vitamins-Minerals (MENS MULTIVITAMIN PO) Take 1 tablet by mouth daily.     pantoprazole (PROTONIX) 40 MG tablet Take 40 mg by mouth 2 (two) times daily.     No facility-administered medications prior to visit.     PAST MEDICAL HISTORY: Past Medical History:  Diagnosis Date   Anxiety    Asthma    Concussion without loss of consciousness    GERD (gastroesophageal reflux disease)    Hypertension    Lower back pain    Migraine    Sinus problem      PAST SURGICAL HISTORY: Past Surgical History:  Procedure Laterality Date   CHOLECYSTECTOMY       FAMILY HISTORY: Family History  Problem Relation Age of Onset   Skin cancer Mother    Heart attack Mother    Skin cancer Father      SOCIAL HISTORY: Social History   Socioeconomic History   Marital status: Married    Spouse name: Not on file   Number of children: Not on file   Years of education: Not on file   Highest education level: Not on file  Occupational History   Not on file  Tobacco Use   Smoking status: Never   Smokeless tobacco: Never  Vaping Use   Vaping status: Never Used  Substance and Sexual Activity   Alcohol use: Yes    Alcohol/week: 1.0 standard drink of alcohol    Types: 1 Cans of beer per week     Comment: socially   Drug use: No   Sexual activity: Yes    Partners: Female  Other Topics Concern   Not on file  Social History Narrative   Not on file   Social Drivers of Health   Financial Resource Strain: Not on file  Food Insecurity: Not on file  Transportation Needs: Not on file  Physical Activity: Not on file  Stress: Not on file  Social Connections: Not on file  Intimate Partner Violence: Not on file     PHYSICAL EXAM  There were no vitals filed for this visit.    There is no height or weight on file to calculate BMI.  Generalized: Well developed, in no acute distress Neurological examination  Mentation: Alert oriented to time, place, history taking. Follows all commands speech and language fluent Cranial nerve II-XII: Pupils were equal round reactive to light. Extraocular movements were full, visual field were full on confrontational test. Facial sensation and strength were normal. Head turning and shoulder shrug  were normal and symmetric. Motor: The motor testing reveals 5 over 5 strength of all 4 extremities. Good symmetric motor tone is noted throughout.  Gait and station: Gait is normal.    DIAGNOSTIC DATA (LABS, IMAGING, TESTING) - I reviewed patient records, labs, notes, testing and imaging myself where available.  No results found for: WBC, HGB, HCT, MCV, PLT    Component Value Date/Time   NA 138 02/06/2020 0819   K 5.0 02/06/2020 0819   CL 98 02/06/2020 0819   CO2 25 02/06/2020 0819   GLUCOSE 103 (H) 02/06/2020 0819   BUN 10 02/06/2020 0819   CREATININE 1.04 02/06/2020 0819   CALCIUM 9.6 02/06/2020 0819   PROT 7.3 02/06/2020 0819   ALBUMIN 4.6 02/06/2020 0819   AST 27 02/06/2020 0819   ALT 39 02/06/2020 0819   ALKPHOS 122 (H) 02/06/2020 9180  BILITOT 0.6 02/06/2020 0819   GFRNONAA 89 02/06/2020 0819   GFRAA 103 02/06/2020 0819   Lab Results  Component Value Date   CHOL 181 01/28/2020   HDL 36 (L) 01/28/2020   LDLCALC 122 (H)  01/28/2020   TRIG 128 01/28/2020   CHOLHDL 5.0 01/28/2020   Lab Results  Component Value Date   HGBA1C 5.3 01/28/2020   No results found for: VITAMINB12 No results found for: TSH      No data to display               No data to display           ASSESSMENT AND PLAN  43 y.o. year old male  has a past medical history of Anxiety, Asthma, Concussion without loss of consciousness, GERD (gastroesophageal reflux disease), Hypertension, Lower back pain, Migraine, and Sinus problem. here with    No diagnosis found.  George Grant is doing very well, today. Headaches are well managed on amitriptline 50mg  at bedtime. We will continue treatment plan. He may consider weaning dose by 1/2 tablet every 4 weeks to see if this helps with daytime grogginess. Compliance report shows excellent compliance. He will continue nightly use for at least 4 hours. Healthy lifestyle habits encouraged. He will follow up with me in 1 year.    No orders of the defined types were placed in this encounter.     No orders of the defined types were placed in this encounter.      Greig Forbes, MSN, FNP-C 05/13/2024, 9:11 AM  [facility] [address], [address] [place], [place] [address] [phone]

## 2024-05-14 ENCOUNTER — Encounter: Payer: Self-pay | Admitting: Family Medicine

## 2024-05-14 ENCOUNTER — Ambulatory Visit: Payer: No Typology Code available for payment source | Admitting: Family Medicine

## 2024-05-14 VITALS — BP 130/82 | HR 93 | Resp 17 | Ht 71.0 in | Wt 187.0 lb

## 2024-05-14 DIAGNOSIS — I10 Essential (primary) hypertension: Secondary | ICD-10-CM

## 2024-05-14 DIAGNOSIS — G43709 Chronic migraine without aura, not intractable, without status migrainosus: Secondary | ICD-10-CM

## 2024-05-14 DIAGNOSIS — G4733 Obstructive sleep apnea (adult) (pediatric): Secondary | ICD-10-CM

## 2024-05-14 MED ORDER — AMITRIPTYLINE HCL 25 MG PO TABS: ORAL_TABLET | ORAL | 3 refills | Status: AC

## 2025-05-27 ENCOUNTER — Ambulatory Visit: Admitting: Family Medicine
